# Patient Record
Sex: Female | Born: 1955 | Race: White | Hispanic: No | Smoking: Former smoker
Health system: Southern US, Community
[De-identification: ages and names within clinical notes are randomized; demographics above are authoritative.]

## PROBLEM LIST (undated history)

## (undated) DIAGNOSIS — M1990 Unspecified osteoarthritis, unspecified site: Secondary | ICD-10-CM

## (undated) DIAGNOSIS — E785 Hyperlipidemia, unspecified: Secondary | ICD-10-CM

## (undated) DIAGNOSIS — C801 Malignant (primary) neoplasm, unspecified: Secondary | ICD-10-CM

## (undated) DIAGNOSIS — K219 Gastro-esophageal reflux disease without esophagitis: Secondary | ICD-10-CM

## (undated) DIAGNOSIS — I10 Essential (primary) hypertension: Secondary | ICD-10-CM

## (undated) DIAGNOSIS — N390 Urinary tract infection, site not specified: Secondary | ICD-10-CM

## (undated) DIAGNOSIS — R7303 Prediabetes: Secondary | ICD-10-CM

## (undated) HISTORY — DX: Urinary tract infection, site not specified: N39.0

---

## 2004-12-14 ENCOUNTER — Ambulatory Visit: Payer: Self-pay | Admitting: Unknown Physician Specialty

## 2006-02-21 ENCOUNTER — Ambulatory Visit: Payer: Self-pay | Admitting: Unknown Physician Specialty

## 2008-03-20 ENCOUNTER — Ambulatory Visit: Payer: Self-pay | Admitting: Unknown Physician Specialty

## 2008-03-26 ENCOUNTER — Ambulatory Visit: Payer: Self-pay | Admitting: Unknown Physician Specialty

## 2009-10-21 ENCOUNTER — Ambulatory Visit: Payer: Self-pay | Admitting: Family Medicine

## 2010-03-17 ENCOUNTER — Ambulatory Visit: Payer: Self-pay | Admitting: Internal Medicine

## 2010-03-17 ENCOUNTER — Emergency Department: Payer: Self-pay | Admitting: Unknown Physician Specialty

## 2010-04-20 ENCOUNTER — Ambulatory Visit: Payer: Self-pay | Admitting: Unknown Physician Specialty

## 2010-08-09 LAB — HM MAMMOGRAPHY

## 2010-11-27 ENCOUNTER — Emergency Department: Payer: Self-pay | Admitting: Emergency Medicine

## 2011-06-01 LAB — HM PAP SMEAR: HM Pap smear: NORMAL

## 2011-06-02 ENCOUNTER — Ambulatory Visit: Payer: Self-pay | Admitting: Internal Medicine

## 2011-06-13 ENCOUNTER — Ambulatory Visit: Payer: Self-pay | Admitting: Internal Medicine

## 2011-07-05 ENCOUNTER — Ambulatory Visit: Payer: Self-pay | Admitting: Surgery

## 2011-07-05 HISTORY — PX: BREAST BIOPSY: SHX20

## 2011-07-19 LAB — PATHOLOGY REPORT

## 2011-08-11 ENCOUNTER — Ambulatory Visit: Payer: Self-pay | Admitting: Surgery

## 2011-08-11 DIAGNOSIS — I10 Essential (primary) hypertension: Secondary | ICD-10-CM

## 2011-08-11 LAB — CBC WITH DIFFERENTIAL/PLATELET
Basophil %: 0.5 %
Eosinophil #: 0.1 10*3/uL (ref 0.0–0.7)
Eosinophil %: 1.6 %
HCT: 40.1 % (ref 35.0–47.0)
Lymphocyte #: 1.7 10*3/uL (ref 1.0–3.6)
Lymphocyte %: 26.5 %
MCH: 30.4 pg (ref 26.0–34.0)
MCHC: 33.9 g/dL (ref 32.0–36.0)
MCV: 90 fL (ref 80–100)
Monocyte #: 0.5 10*3/uL (ref 0.0–0.7)
Neutrophil #: 4.2 10*3/uL (ref 1.4–6.5)
Neutrophil %: 64.4 %
RBC: 4.48 10*6/uL (ref 3.80–5.20)
WBC: 6.5 10*3/uL (ref 3.6–11.0)

## 2011-08-11 LAB — BASIC METABOLIC PANEL
Anion Gap: 12 (ref 7–16)
BUN: 14 mg/dL (ref 7–18)
Chloride: 106 mmol/L (ref 98–107)
Co2: 25 mmol/L (ref 21–32)
Creatinine: 0.95 mg/dL (ref 0.60–1.30)
EGFR (African American): >60
Potassium: 3.8 mmol/L (ref 3.5–5.1)
Sodium: 143 mmol/L (ref 136–145)

## 2011-08-22 ENCOUNTER — Ambulatory Visit: Payer: Self-pay | Admitting: Surgery

## 2011-08-22 HISTORY — PX: BREAST BIOPSY: SHX20

## 2012-09-19 HISTORY — PX: COLONOSCOPY: SHX174

## 2012-10-03 LAB — HM COLONOSCOPY

## 2013-07-17 LAB — LIPID PANEL
Cholesterol: 215 mg/dL — AB (ref 0–200)
HDL: 75 mg/dL — AB (ref 35–70)
LDL Cholesterol: 117 mg/dL
TRIGLYCERIDES: 117 mg/dL (ref 40–160)

## 2013-07-17 LAB — TSH: TSH: 1.1 u[IU]/mL (ref <=5.90)

## 2014-11-17 LAB — BASIC METABOLIC PANEL
BUN: 13 mg/dL (ref 4–21)
CREATININE: 0.7 mg/dL (ref <=1.1)

## 2014-11-17 LAB — CBC AND DIFFERENTIAL: HEMOGLOBIN: 13.7 g/dL (ref 12.0–16.0)

## 2014-11-21 ENCOUNTER — Ambulatory Visit
Admit: 2014-11-21 | Disposition: A | Discharge status: Home / Self Care | Payer: Self-pay | Attending: Internal Medicine | Admitting: Internal Medicine

## 2014-11-30 NOTE — H&P (Signed)
PATIENT NAME:  Lisa Haas, Lisa Haas MR#:  [id number] DATE OF BIRTH:  [birth year]  DATE OF ADMISSION:  08/22/2011  CHIEF COMPLAINT: Left mammographic abnormality.   HISTORY OF PRESENT ILLNESS: This is a patient with a history of stereotactic core biopsy which was initially read as atypical ductal hyperplasia but a second opinion from the pathologist at [facility] suggested that there could be suspicion for DCIS present as well. She is here for  elective excisional breast biopsy with needle localization.   PAST MEDICAL HISTORY:  1. Hyperlipidemia.  2. Hypertension.  3. Abnormal mammogram.   PAST SURGICAL HISTORY: None.   MEDICATIONS:  1. Amlodipine.  2. Lipitor.  3. Meclizine.   ALLERGIES: Penicillin and sulfa.   SOCIAL HISTORY: She is a former smoker. She drinks alcohol and is married.   FAMILY HISTORY:  Family history of breast cancer in her mother.  GYN HISTORY: G1, P1, Ab0.   REVIEW OF SYSTEMS: 10-system review has been performed and is documented in the office chart.   PHYSICAL EXAMINATION:  GENERAL: Healthy female patient.   NECK: No palpable neck nodes.   CHEST: Clear to auscultation.   CARDIAC: Regular rate and rhythm.   ABDOMEN: Soft, nontender.   EXTREMITIES: Without edema. Calves are nontender.   NEUROLOGIC: Grossly intact.   BREASTS: Examination shows no mass in either breast. There is a scar in the upper central portion of the left breast at a prior biopsy site.   ASSESSMENT AND PLAN: This is a patient with a core biopsy suggesting atypical ductal hyperplasia but with, possibly some atypia or ductal carcinoma in situ. She warrants excisional biopsy for this. The rationale for this has been discussed. The options of observation have been reviewed and the risks of bleeding, infection, recurrence, missed lesion, cosmetic deformity, hematoma, and seroma  have all been reviewed with her. She understood and agreed to proceed.    ____________________________ Jerrol Banana  Burt Knack, MD rec:bjt D: 08/21/2011 18:29:24 ET T: 08/22/2011 06:44:11 ET JOB#: [id number]  cc: Jerrol Banana. Burt Knack, MD, <Dictator> Florene Glen MD ELECTRONICALLY SIGNED 08/26/2011 21:44

## 2014-11-30 NOTE — Op Note (Signed)
PATIENT NAME:  Lisa Haas, Lisa Haas MR#:  [id number] DATE OF BIRTH:  1955-11-25  DATE OF PROCEDURE:  08/22/2011  PREOPERATIVE DIAGNOSIS: Left breast mammographic abnormality.   POSTOPERATIVE DIAGNOSIS: Left breast mammographic abnormality.  PROCEDURE PERFORMED:  Left needle localization breast biopsy.   SURGEON: Phoebe Perch, MD   ANESTHESIA: General with LMA.   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   Preoperatively, we discussed the rationale for surgery, the need for an excisional biopsy, and the rationale for offering excisional biopsy. We also discussed the risks of bleeding, infection, hematoma, seroma, and cosmetic deformity as well as missed lesion. This was all reviewed for her and her husband in the preop holding area. They understood and agreed to proceed.   FINDINGS: The previously-placed marker lie adjacent to the thick portion of the needle localization wire. The very tip of the wire was dislodged from the main body of the wire and remained in the patient. It was not able to be retrieved, or visible, or palpable. It was not seen on specimen mammogram. This was discussed with Dr. Burt Knack in Radiology, and I asked him to make note of this on the mammogram dictation so that future mammography would show this small portion of the tip of the wire. Again, the targeted area of the mammogram, meaning the mammographic abnormality as well as the metallic clip, was removed.   DESCRIPTION OF PROCEDURE: The patient was induced to general anesthesia, prepped and draped in sterile fashion. Marcaine was infiltrated in the skin and subcutaneous tissues around the previously placed needle localization wire and an incision was made. Dissection down along the wire was performed; and in elevating the specimen, it was noted that the tip of the wire was not in  the specimen suggesting that it had become dislodged within the tissue. The specimen was elevated and inspected and sectioned at the tip to assess for presence of the tip of the hook wire. Palpation and visual inspection of the cavity was performed. Additional tissue was removed and inspected and sent off for specimen mammogram.    Dr. Burt Knack called back stating that the specimen was targeted at the clip, and the marker was actually removed as expected; however, he did not see the tip of the wire. He suggested that he could do additional study and that was not visible.   Further palpation and visual inspection down to the chest wall was performed. It was decided that the very tip of the needle would cause no harm to the patient if being left in and that certainly additional excisional biopsy could result in additional harm or cosmetic deformity. Therefore, it was left in place knowing that the area of target was adequately excised.   Hemostasis was with electrocautery. Additional Marcaine was placed for a total of 30 mL. Another inspection was performed, and still the wire could not be palpated or visible. Therefore, closure was performed after assuring that hemostasis was adequate. This was done with deep sutures of 3-0 Vicryl followed by 4-0 subcuticular Monocryl. Steri-Strips, Mastisol, and sterile dressings were placed.   The patient tolerated the procedure well. There were no complications. She was taken to the recovery room in stable condition to be discharged in the care of her family with follow-up in 10 days.    ____________________________ Jerrol Banana Burt Knack, MD rec:cbb D: 08/22/2011 12:55:07  ET T: 08/22/2011 13:25:44 ET JOB#: [id number]  cc: Jerrol Banana. Burt Knack, MD, <Dictator> Florene Glen MD ELECTRONICALLY SIGNED 08/26/2011 21:44

## 2015-06-20 ENCOUNTER — Encounter: Payer: Self-pay | Admitting: Internal Medicine

## 2015-06-22 ENCOUNTER — Other Ambulatory Visit: Payer: Self-pay | Admitting: Internal Medicine

## 2015-06-22 DIAGNOSIS — I10 Essential (primary) hypertension: Secondary | ICD-10-CM | POA: Insufficient documentation

## 2015-06-22 DIAGNOSIS — N951 Menopausal and female climacteric states: Secondary | ICD-10-CM | POA: Insufficient documentation

## 2015-06-22 DIAGNOSIS — L918 Other hypertrophic disorders of the skin: Secondary | ICD-10-CM | POA: Insufficient documentation

## 2015-06-22 DIAGNOSIS — E785 Hyperlipidemia, unspecified: Secondary | ICD-10-CM | POA: Insufficient documentation

## 2015-06-22 DIAGNOSIS — N6019 Diffuse cystic mastopathy of unspecified breast: Secondary | ICD-10-CM | POA: Insufficient documentation

## 2015-06-22 DIAGNOSIS — R002 Palpitations: Secondary | ICD-10-CM | POA: Insufficient documentation

## 2015-06-22 DIAGNOSIS — Z86010 Personal history of colonic polyps: Secondary | ICD-10-CM | POA: Insufficient documentation

## 2015-10-05 ENCOUNTER — Other Ambulatory Visit: Payer: Self-pay | Admitting: Internal Medicine

## 2015-10-05 DIAGNOSIS — Z1231 Encounter for screening mammogram for malignant neoplasm of breast: Secondary | ICD-10-CM

## 2015-10-06 ENCOUNTER — Ambulatory Visit
Admission: RE | Admit: 2015-10-06 | Discharge: 2015-10-06 | Disposition: A | Discharge status: Home / Self Care | Payer: BC Managed Care – PPO | Source: Ambulatory Visit | Attending: Internal Medicine | Admitting: Internal Medicine

## 2015-10-06 DIAGNOSIS — Z1231 Encounter for screening mammogram for malignant neoplasm of breast: Secondary | ICD-10-CM | POA: Diagnosis present

## 2015-12-02 ENCOUNTER — Other Ambulatory Visit: Payer: Self-pay | Admitting: Internal Medicine

## 2016-03-07 ENCOUNTER — Other Ambulatory Visit: Payer: Self-pay | Admitting: Internal Medicine

## 2016-03-23 ENCOUNTER — Encounter: Payer: Self-pay | Admitting: Internal Medicine

## 2016-03-23 ENCOUNTER — Ambulatory Visit (INDEPENDENT_AMBULATORY_CARE_PROVIDER_SITE_OTHER): Payer: BC Managed Care – PPO | Admitting: Internal Medicine

## 2016-03-23 VITALS — BP 130/80 | HR 74 | Resp 16 | Ht 67.25 in | Wt 162.0 lb

## 2016-03-23 DIAGNOSIS — E785 Hyperlipidemia, unspecified: Secondary | ICD-10-CM

## 2016-03-23 DIAGNOSIS — Z124 Encounter for screening for malignant neoplasm of cervix: Secondary | ICD-10-CM | POA: Diagnosis not present

## 2016-03-23 DIAGNOSIS — Z0000 Encounter for general adult medical examination without abnormal findings: Secondary | ICD-10-CM

## 2016-03-23 DIAGNOSIS — I10 Essential (primary) hypertension: Secondary | ICD-10-CM

## 2016-03-23 DIAGNOSIS — F39 Unspecified mood [affective] disorder: Secondary | ICD-10-CM | POA: Diagnosis not present

## 2016-03-23 LAB — POCT URINALYSIS DIPSTICK
Bilirubin, UA: NEGATIVE
Blood, UA: NEGATIVE
GLUCOSE UA: NEGATIVE
Ketones, UA: NEGATIVE
LEUKOCYTES UA: NEGATIVE
Nitrite, UA: NEGATIVE
Protein, UA: NEGATIVE
SPEC GRAV UA: 1.025
pH, UA: 5

## 2016-03-23 MED ORDER — ATORVASTATIN CALCIUM 20 MG PO TABS: 20.0000 mg | ORAL_TABLET | Freq: Every day | ORAL | 3 refills | Status: DC

## 2016-03-23 MED ORDER — AMLODIPINE BESYLATE 10 MG PO TABS: 10.0000 mg | ORAL_TABLET | Freq: Every day | ORAL | 3 refills | Status: DC

## 2016-03-23 MED ORDER — ESCITALOPRAM OXALATE 10 MG PO TABS: 10.0000 mg | ORAL_TABLET | Freq: Every day | ORAL | 5 refills | Status: DC

## 2016-03-23 NOTE — Progress Notes (Signed)
Date:  03/23/2016   Name:  Lisa Haas   DOB:  27-Jul-1956   MRN:  [id number]   Chief Complaint: Annual Exam (PAP today) and Stress (JUST FYI not needing to treat stress just informing us: Husband dx stage 4 brain cancer) Lisa Haas is a 60 y.o. female who presents today for her Complete Annual Exam. She feels fairly well. She reports exercising some. She reports she is sleeping poorly because she gets up and down with her husband who has left sided hemiparesis.  She is working full time in caring for her husband nights and weekends. She has help from several family members during the day when she is working. She does find yourself to be very stressed at times some anxiety and panic symptoms as well as tearful and sad. She has lost about 15 pounds during the course of his illness since November. She feels like she is doing fairly well but might benefit from antidepressant and anti-anxiety medication. Hypertension  This is a chronic problem. The current episode started more than 1 year ago. The problem is unchanged. The problem is controlled. Pertinent negatives include no chest pain, headaches, palpitations or shortness of breath.  Hyperlipidemia  This is a chronic problem. The problem is controlled. Pertinent negatives include no chest pain or shortness of breath. Current antihyperlipidemic treatment includes statins. The current treatment provides significant improvement of lipids.    Review of Systems  Constitutional: Positive for unexpected weight change. Negative for chills, fatigue and fever.  HENT: Negative for congestion, hearing loss, tinnitus, trouble swallowing and voice change.   Eyes: Negative for visual disturbance.  Respiratory: Negative for cough, chest tightness, shortness of breath and wheezing.   Cardiovascular: Negative for chest pain, palpitations and leg swelling.  Gastrointestinal: Negative for abdominal pain, constipation, diarrhea and vomiting.  Endocrine:  Negative for polydipsia and polyuria.  Genitourinary: Negative for dysuria, frequency, genital sores, vaginal bleeding and vaginal discharge.  Musculoskeletal: Negative for arthralgias, gait problem and joint swelling.  Skin: Negative for color change and rash.  Neurological: Negative for dizziness, tremors, light-headedness and headaches.  Hematological: Negative for adenopathy. Does not bruise/bleed easily.  Psychiatric/Behavioral: Positive for dysphoric mood and sleep disturbance. Negative for agitation and confusion. The patient is nervous/anxious.     Patient Active Problem List   Diagnosis Date Noted  . 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 Dyslipidemia 06/22/2015  . Essential (primary) hypertension 06/22/2015  . H/O adenomatous polyp of colon 06/22/2015  . Hot flash, menopausal 06/22/2015  . Awareness of heartbeats 06/22/2015  . 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 Authorizing Provider  amLODipine (NORVASC) 10 MG tablet TAKE ONE TABLET BY MOUTH ONCE DAILY 03/07/16  Yes Glean Hess, MD  atorvastatin (LIPITOR) 20 MG tablet TAKE ONE TABLET BY MOUTH AT BEDTIME 03/07/16  Yes Glean Hess, MD    Allergies  Allergen Reactions  . Penicillins   . Sulfa Antibiotics     Other reaction(s): Headache    Past Surgical History:  Procedure Laterality Date  . BREAST BIOPSY Left 08/22/11   benign  . BREAST BIOPSY Left 07/05/11   lt stereo/clip-neg    Social History  Substance Use Topics  . Smoking status: Former Research scientist (life sciences)  . Smokeless tobacco: Never Used  . Alcohol use 1.2 oz/week    2 Standard drinks or equivalent per week     Medication list has been reviewed and updated.  Physical Exam  Constitutional: She is oriented to person, place, and time. She appears well-developed and well-nourished. No distress.  HENT:  Head: Normocephalic and atraumatic.  Right Ear: Tympanic membrane and ear canal normal.  Left  Ear: Tympanic membrane and ear canal normal.  Nose: Right sinus exhibits no maxillary sinus tenderness. Left sinus exhibits no maxillary sinus tenderness.  Mouth/Throat: Uvula is midline and oropharynx is clear and moist.  Eyes: Conjunctivae and EOM are normal. Right eye exhibits no discharge. Left eye exhibits no discharge. No scleral icterus.  Neck: Normal range of motion. Carotid bruit is not present. No erythema present. No thyromegaly present.  Cardiovascular: Normal rate, regular rhythm, normal heart sounds and normal pulses.   Pulmonary/Chest: Effort normal. No respiratory distress. She has no wheezes. Right breast exhibits no mass, no nipple discharge, no skin change and no tenderness. Left breast exhibits no mass, no nipple discharge, no skin change and no tenderness.  Abdominal: Soft. Bowel sounds are normal. There is no hepatosplenomegaly. There is no tenderness. There is no CVA tenderness.  Genitourinary: Rectum normal, vagina normal and uterus normal. There is no tenderness, lesion or injury on the right labia. There is no tenderness, lesion or injury on the left labia. Cervix exhibits no motion tenderness, no discharge and no friability. Right adnexum displays no mass, no tenderness and no fullness. Left adnexum displays no mass, no tenderness and no fullness.  Genitourinary Comments: Mild rectocele - asx  Musculoskeletal: Normal range of motion.  Lymphadenopathy:    She has no cervical adenopathy.    She has no axillary adenopathy.  Neurological: She is alert and oriented to person, place, and time. She has normal reflexes. No cranial nerve deficit or sensory deficit.  Skin: Skin is warm, dry and intact. No rash noted.  Psychiatric: Her speech is normal and behavior is normal. Thought content normal. She exhibits a depressed mood (and tearful but appropriate).  Nursing note and vitals reviewed.   BP 130/80 (BP Location: Right Arm, Patient Position: Sitting, Cuff Size: Normal)    Pulse 74   Resp 16   Ht 5' 7.25" (1.708 m)   Wt 162 lb (73.5 kg)   SpO2 100%   BMI 25.18 kg/m   Assessment and Plan: 1. Annual physical exam Pap obtained; mammogram up to date - POCT urinalysis dipstick  2. Essential (primary) hypertension controlled - CBC with Differential/Platelet - Comprehensive metabolic panel - TSH - amLODipine (NORVASC) 10 MG tablet; Take 1 tablet (10 mg total) by mouth daily.  Dispense: 90 tablet; Refill: 3  3. Dyslipidemia On statin therapy - Lipid panel - atorvastatin (LIPITOR) 20 MG tablet; Take 1 tablet (20 mg total) by mouth at bedtime.  Dispense: 90 tablet; Refill: 3  4. Encounter for screening for cervical cancer  - Pap IG and HPV (high risk) DNA detection  5. Mood disorder ([place]) Begin medication - follow up if needed - escitalopram (LEXAPRO) 10 MG tablet; Take 1 tablet (10 mg total) by mouth daily.  Dispense: 30 tablet; Refill: [place], MD [facility]  03/23/2016

## 2016-03-23 NOTE — Patient Instructions (Signed)
DASH Eating Plan  DASH stands for "Dietary Approaches to Stop Hypertension." The DASH eating plan is a healthy eating plan that has been shown to reduce high blood pressure (hypertension). Additional health benefits may include reducing the risk of type 2 diabetes mellitus, heart disease, and stroke. The DASH eating plan may also help with weight loss.  WHAT DO I NEED TO KNOW ABOUT THE DASH EATING PLAN?  For the DASH eating plan, you will follow these general guidelines:  · Choose foods with a percent daily value for sodium of less than 5% (as listed on the food label).  · Use salt-free seasonings or herbs instead of table salt or sea salt.  · Check with your health care provider or pharmacist before using salt substitutes.  · Eat lower-sodium products, often labeled as "lower sodium" or "no salt added."  · Eat fresh foods.  · Eat more vegetables, fruits, and low-fat dairy products.  · Choose whole grains. Look for the word "whole" as the first word in the ingredient list.  · Choose fish and skinless chicken or turkey more often than red meat. Limit fish, poultry, and meat to 6 oz (170 g) each day.  · Limit sweets, desserts, sugars, and sugary drinks.  · Choose heart-healthy fats.  · Limit cheese to 1 oz (28 g) per day.  · Eat more home-cooked food and less restaurant, buffet, and fast food.  · Limit fried foods.  · Cook foods using methods other than frying.  · Limit canned vegetables. If you do use them, rinse them well to decrease the sodium.  · When eating at a restaurant, ask that your food be prepared with less salt, or no salt if possible.  WHAT FOODS CAN I EAT?  Seek help from a dietitian for individual calorie needs.  Grains  Whole grain or whole wheat bread. Brown rice. Whole grain or whole wheat pasta. Quinoa, bulgur, and whole grain cereals. Low-sodium cereals. Corn or whole wheat flour tortillas. Whole grain cornbread. Whole grain crackers. Low-sodium crackers.  Vegetables  Fresh or frozen vegetables  (raw, steamed, roasted, or grilled). Low-sodium or reduced-sodium tomato and vegetable juices. Low-sodium or reduced-sodium tomato sauce and paste. Low-sodium or reduced-sodium canned vegetables.   Fruits  All fresh, canned (in natural juice), or frozen fruits.  Meat and Other Protein Products  Ground beef (85% or leaner), grass-fed beef, or beef trimmed of fat. Skinless chicken or turkey. Ground chicken or turkey. Pork trimmed of fat. All fish and seafood. Eggs. Dried beans, peas, or lentils. Unsalted nuts and seeds. Unsalted canned beans.  Dairy  Low-fat dairy products, such as skim or 1% milk, 2% or reduced-fat cheeses, low-fat ricotta or cottage cheese, or plain low-fat yogurt. Low-sodium or reduced-sodium cheeses.  Fats and Oils  Tub margarines without trans fats. Light or reduced-fat mayonnaise and salad dressings (reduced sodium). Avocado. Safflower, olive, or canola oils. Natural peanut or almond butter.  Other  Unsalted popcorn and pretzels.  The items listed above may not be a complete list of recommended foods or beverages. Contact your dietitian for more options.  WHAT FOODS ARE NOT RECOMMENDED?  Grains  White bread. White pasta. White rice. Refined cornbread. Bagels and croissants. Crackers that contain trans fat.  Vegetables  Creamed or fried vegetables. Vegetables in a cheese sauce. Regular canned vegetables. Regular canned tomato sauce and paste. Regular tomato and vegetable juices.  Fruits  Dried fruits. Canned fruit in light or heavy syrup. Fruit juice.  Meat and Other Protein   Products  Fatty cuts of meat. Ribs, chicken wings, bacon, sausage, bologna, salami, chitterlings, fatback, hot dogs, bratwurst, and packaged luncheon meats. Salted nuts and seeds. Canned beans with salt.  Dairy  Whole or 2% milk, cream, half-and-half, and cream cheese. Whole-fat or sweetened yogurt. Full-fat cheeses or blue cheese. Nondairy creamers and whipped toppings. Processed cheese, cheese spreads, or cheese  curds.  Condiments  Onion and garlic salt, seasoned salt, table salt, and sea salt. Canned and packaged gravies. Worcestershire sauce. Tartar sauce. Barbecue sauce. Teriyaki sauce. Soy sauce, including reduced sodium. Steak sauce. Fish sauce. Oyster sauce. Cocktail sauce. Horseradish. Ketchup and mustard. Meat flavorings and tenderizers. Bouillon cubes. Hot sauce. Tabasco sauce. Marinades. Taco seasonings. Relishes.  Fats and Oils  Butter, stick margarine, lard, shortening, ghee, and bacon fat. Coconut, palm kernel, or palm oils. Regular salad dressings.  Other  Pickles and olives. Salted popcorn and pretzels.  The items listed above may not be a complete list of foods and beverages to avoid. Contact your dietitian for more information.  WHERE CAN I FIND MORE INFORMATION?  National Heart, Lung, and Blood Institute: www.nhlbi.nih.gov/health/health-topics/topics/dash/     This information is not intended to replace advice given to you by your health care provider. Make sure you discuss any questions you have with your health care provider.     Document Released: 07/14/2011 Document Revised: 08/15/2014 Document Reviewed: 05/29/2013  Elsevier Interactive Patient Education ©2016 [employer] Inc[employer]

## 2016-03-24 LAB — CBC WITH DIFFERENTIAL/PLATELET
BASOS ABS: 0 10*3/uL (ref 0.0–0.2)
BASOS: 1 %
EOS (ABSOLUTE): 0.1 10*3/uL (ref 0.0–0.4)
Eos: 1 %
Hematocrit: 42.6 % (ref 34.0–46.6)
Hemoglobin: 14 g/dL (ref 11.1–15.9)
IMMATURE GRANS (ABS): 0 10*3/uL (ref 0.0–0.1)
IMMATURE GRANULOCYTES: 0 %
LYMPHS: 32 %
Lymphocytes Absolute: 2.3 10*3/uL (ref 0.7–3.1)
MCH: 30.1 pg (ref 26.6–33.0)
MCHC: 32.9 g/dL (ref 31.5–35.7)
MCV: 92 fL (ref 79–97)
Monocytes Absolute: 0.3 10*3/uL (ref 0.1–0.9)
Monocytes: 4 %
NEUTROS PCT: 62 %
Neutrophils Absolute: 4.4 10*3/uL (ref 1.4–7.0)
PLATELETS: 243 10*3/uL (ref 150–379)
RBC: 4.65 x10E6/uL (ref 3.77–5.28)
RDW: 12.8 % (ref 12.3–15.4)
WBC: 7.1 10*3/uL (ref 3.4–10.8)

## 2016-03-24 LAB — COMPREHENSIVE METABOLIC PANEL
ALT: 19 IU/L (ref 0–32)
AST: 23 IU/L (ref 0–40)
Albumin/Globulin Ratio: 2.2 (ref 1.2–2.2)
Albumin: 5 g/dL (ref 3.5–5.5)
Alkaline Phosphatase: 75 IU/L (ref 39–117)
BUN/Creatinine Ratio: 17 (ref 9–23)
BUN: 11 mg/dL (ref 6–24)
Bilirubin Total: 0.4 mg/dL (ref 0.0–1.2)
CO2: 21 mmol/L (ref 18–29)
Calcium: 9.7 mg/dL (ref 8.7–10.2)
Chloride: 102 mmol/L (ref 96–106)
Creatinine, Ser: 0.66 mg/dL (ref 0.57–1.00)
GFR, EST AFRICAN AMERICAN: 113 mL/min/{1.73_m2} (ref >59)
GFR, EST NON AFRICAN AMERICAN: 98 mL/min/{1.73_m2} (ref >59)
Globulin, Total: 2.3 g/dL (ref 1.5–4.5)
Glucose: 98 mg/dL (ref 65–99)
Potassium: 4 mmol/L (ref 3.5–5.2)
Sodium: 142 mmol/L (ref 134–144)
TOTAL PROTEIN: 7.3 g/dL (ref 6.0–8.5)

## 2016-03-24 LAB — LIPID PANEL
CHOL/HDL RATIO: 2.7 ratio (ref 0.0–4.4)
Cholesterol, Total: 214 mg/dL — ABNORMAL HIGH (ref 100–199)
HDL: 79 mg/dL (ref >39)
LDL Calculated: 113 mg/dL — ABNORMAL HIGH (ref 0–99)
TRIGLYCERIDES: 108 mg/dL (ref 0–149)
VLDL CHOLESTEROL CAL: 22 mg/dL (ref 5–40)

## 2016-03-24 LAB — TSH: TSH: 1.68 u[IU]/mL (ref 0.450–4.500)

## 2016-03-26 LAB — PAP IG AND HPV HIGH-RISK
HPV, HIGH-RISK: NEGATIVE
PAP Smear Comment: 0

## 2016-06-01 ENCOUNTER — Ambulatory Visit (INDEPENDENT_AMBULATORY_CARE_PROVIDER_SITE_OTHER): Payer: BC Managed Care – PPO | Admitting: Internal Medicine

## 2016-06-01 ENCOUNTER — Encounter: Payer: Self-pay | Admitting: Internal Medicine

## 2016-06-01 VITALS — BP 122/78 | HR 84 | Temp 98.7°F | Resp 16 | Ht 67.25 in | Wt 161.0 lb

## 2016-06-01 DIAGNOSIS — H1033 Unspecified acute conjunctivitis, bilateral: Secondary | ICD-10-CM | POA: Diagnosis not present

## 2016-06-01 MED ORDER — NEOMYCIN-POLYMYXIN-DEXAMETH 3.5-10000-0.1 OP SUSP: 2.0000 [drp] | Freq: Four times a day (QID) | OPHTHALMIC | 1 refills | Status: DC

## 2016-06-01 NOTE — Patient Instructions (Signed)

## 2016-06-01 NOTE — Progress Notes (Signed)
Date:  06/01/2016   Name:  Lisa Haas   DOB:  1956/05/24   MRN:  [id number]   Chief Complaint: Conjunctivitis (1 day - was matted this morning ); Sore Throat (this morning but not now. ); and Headache (Last night and today ) Conjunctivitis   The current episode started yesterday. The onset was sudden. The problem occurs continuously. The problem has been gradually worsening. The problem is moderate. Nothing relieves the symptoms. Associated symptoms include eye discharge and eye redness. Pertinent negatives include no fever. The eye pain is mild. The left eye is affected.      Review of Systems  Constitutional: Negative for chills, fatigue and fever.  HENT: Negative for facial swelling and sinus pressure.   Eyes: Positive for discharge, redness and visual disturbance.  Respiratory: Negative for chest tightness and shortness of breath.   Cardiovascular: Negative for chest pain.    Patient Active Problem List   Diagnosis Date Noted  . 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 Dyslipidemia 06/22/2015  . Essential (primary) hypertension 06/22/2015  . H/O adenomatous polyp of colon 06/22/2015  . Hot flash, menopausal 06/22/2015  . Awareness of heartbeats 06/22/2015  . 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 Authorizing Provider  amLODipine (NORVASC) 10 MG tablet Take 1 tablet (10 mg total) by mouth daily. 03/23/16  Yes Glean Hess, MD  atorvastatin (LIPITOR) 20 MG tablet Take 1 tablet (20 mg total) by mouth at bedtime. 03/23/16  Yes Glean Hess, MD  escitalopram (LEXAPRO) 10 MG tablet Take 1 tablet (10 mg total) by mouth daily. 03/23/16  Yes Glean Hess, MD    Allergies  Allergen Reactions  . Penicillins   . Sulfa Antibiotics     Other reaction(s): Headache    Past Surgical History:  Procedure Laterality Date  . BREAST BIOPSY Left 08/22/11   benign  . BREAST BIOPSY Left 07/05/11   lt stereo/clip-neg      Social History  Substance Use Topics  . Smoking status: Former Research scientist (life sciences)  . Smokeless tobacco: Never Used  . Alcohol use 1.2 oz/week    2 Standard drinks or equivalent per week     Medication list has been reviewed and updated.   Physical Exam  Constitutional: She is oriented to person, place, and time. She appears well-developed. No distress.  HENT:  Head: Normocephalic and atraumatic.  Right Ear: Tympanic membrane and ear canal normal.  Left Ear: Tympanic membrane and ear canal normal.  Nose: Right sinus exhibits no maxillary sinus tenderness and no frontal sinus tenderness. Left sinus exhibits no maxillary sinus tenderness and no frontal sinus tenderness.  Mouth/Throat: Oropharynx is clear and moist.  Eyes: Right eye exhibits no chemosis and no discharge. Left eye exhibits chemosis and discharge. Right conjunctiva is not injected. Left conjunctiva is injected.  Cardiovascular: Normal rate and regular rhythm.   Pulmonary/Chest: Effort normal and breath sounds normal. No respiratory distress.  Musculoskeletal: Normal range of motion.  Neurological: She is alert and oriented to person, place, and time.  Skin: Skin is warm and dry. No rash noted.  Psychiatric: She has a normal mood and affect. Her behavior is normal. Thought content normal.  Nursing note and vitals reviewed.   BP 122/78   Pulse 84   Temp 98.7 F (37.1 C) (Oral)   Resp 16   Ht 5' 7.25" (1.708 m)   Wt 161 lb (73 kg)   SpO2  98%   BMI 25.03 kg/m   Assessment and Plan: 1. Acute conjunctivitis of both eyes, unspecified acute conjunctivitis type - neomycin-polymyxin b-dexamethasone (MAXITROL) 3.5-10000-0.1 SUSP; Place 2 drops into both eyes every 6 (six) hours.  Dispense: 5 mL; Refill: [place], MD [facility]  06/01/2016

## 2016-11-22 ENCOUNTER — Other Ambulatory Visit: Payer: Self-pay | Admitting: Internal Medicine

## 2016-11-22 DIAGNOSIS — Z1239 Encounter for other screening for malignant neoplasm of breast: Secondary | ICD-10-CM

## 2016-11-28 ENCOUNTER — Ambulatory Visit: Payer: BC Managed Care – PPO

## 2016-11-30 ENCOUNTER — Ambulatory Visit
Admission: RE | Admit: 2016-11-30 | Discharge: 2016-11-30 | Disposition: A | Discharge status: Home / Self Care | Payer: BC Managed Care – PPO | Source: Ambulatory Visit | Attending: Internal Medicine | Admitting: Internal Medicine

## 2016-11-30 DIAGNOSIS — Z1231 Encounter for screening mammogram for malignant neoplasm of breast: Secondary | ICD-10-CM | POA: Diagnosis not present

## 2016-11-30 DIAGNOSIS — Z1239 Encounter for other screening for malignant neoplasm of breast: Secondary | ICD-10-CM

## 2017-03-27 ENCOUNTER — Encounter: Payer: Self-pay | Admitting: Internal Medicine

## 2017-03-27 ENCOUNTER — Ambulatory Visit (INDEPENDENT_AMBULATORY_CARE_PROVIDER_SITE_OTHER): Payer: BC Managed Care – PPO | Admitting: Internal Medicine

## 2017-03-27 VITALS — BP 128/80 | HR 99 | Ht 67.25 in | Wt 166.0 lb

## 2017-03-27 DIAGNOSIS — E785 Hyperlipidemia, unspecified: Secondary | ICD-10-CM | POA: Diagnosis not present

## 2017-03-27 DIAGNOSIS — Z0000 Encounter for general adult medical examination without abnormal findings: Secondary | ICD-10-CM | POA: Diagnosis not present

## 2017-03-27 DIAGNOSIS — Z1231 Encounter for screening mammogram for malignant neoplasm of breast: Secondary | ICD-10-CM

## 2017-03-27 DIAGNOSIS — I10 Essential (primary) hypertension: Secondary | ICD-10-CM | POA: Diagnosis not present

## 2017-03-27 DIAGNOSIS — R002 Palpitations: Secondary | ICD-10-CM

## 2017-03-27 LAB — POCT URINALYSIS DIPSTICK
Bilirubin, UA: NEGATIVE
Glucose, UA: NEGATIVE
Ketones, UA: NEGATIVE
LEUKOCYTES UA: NEGATIVE
NITRITE UA: NEGATIVE
PH UA: 6 (ref 5.0–8.0)
RBC UA: NEGATIVE
Spec Grav, UA: 1.025 (ref 1.010–1.025)
UROBILINOGEN UA: 0.2 U/dL

## 2017-03-27 MED ORDER — AMLODIPINE BESYLATE 10 MG PO TABS: 10.0000 mg | ORAL_TABLET | Freq: Every day | ORAL | 3 refills | Status: DC

## 2017-03-27 MED ORDER — ATORVASTATIN CALCIUM 20 MG PO TABS: 20.0000 mg | ORAL_TABLET | Freq: Every day | ORAL | 3 refills | Status: DC

## 2017-03-27 NOTE — Patient Instructions (Signed)
DASH Eating Plan DASH stands for "Dietary Approaches to Stop Hypertension." The DASH eating plan is a healthy eating plan that has been shown to reduce high blood pressure (hypertension). It may also reduce your risk for type 2 diabetes, heart disease, and stroke. The DASH eating plan may also help with weight loss. What are tips for following this plan? General guidelines  Avoid eating more than 2,300 mg (milligrams) of salt (sodium) a day. If you have hypertension, you may need to reduce your sodium intake to 1,500 mg a day.  Limit alcohol intake to no more than 1 drink a day for nonpregnant women and 2 drinks a day for men. One drink equals 12 oz of beer, 5 oz of wine, or 1 oz of hard liquor.  Work with your health care provider to maintain a healthy body weight or to lose weight. Ask what an ideal weight is for you.  Get at least 30 minutes of exercise that causes your heart to beat faster (aerobic exercise) most days of the week. Activities may include walking, swimming, or biking.  Work with your health care provider or diet and nutrition specialist (dietitian) to adjust your eating plan to your individual calorie needs. Reading food labels  Check food labels for the amount of sodium per serving. Choose foods with less than 5 percent of the Daily Value of sodium. Generally, foods with less than 300 mg of sodium per serving fit into this eating plan.  To find whole grains, look for the word "whole" as the first word in the ingredient list. Shopping  Buy products labeled as "low-sodium" or "no salt added."  Buy fresh foods. Avoid canned foods and premade or frozen meals. Cooking  Avoid adding salt when cooking. Use salt-free seasonings or herbs instead of table salt or sea salt. Check with your health care provider or pharmacist before using salt substitutes.  Do not fry foods. Cook foods using healthy methods such as baking, boiling, grilling, and broiling instead.  Cook with  heart-healthy oils, such as olive, canola, soybean, or sunflower oil. Meal planning   Eat a balanced diet that includes: ? 5 or more servings of fruits and vegetables each day. At each meal, try to fill half of your plate with fruits and vegetables. ? Up to 6-8 servings of whole grains each day. ? Less than 6 oz of lean meat, poultry, or fish each day. A 3-oz serving of meat is about the same size as a deck of cards. One egg equals 1 oz. ? 2 servings of low-fat dairy each day. ? A serving of nuts, seeds, or beans 5 times each week. ? Heart-healthy fats. Healthy fats called Omega-3 fatty acids are found in foods such as flaxseeds and coldwater fish, like sardines, salmon, and mackerel.  Limit how much you eat of the following: ? Canned or prepackaged foods. ? Food that is high in trans fat, such as fried foods. ? Food that is high in saturated fat, such as fatty meat. ? Sweets, desserts, sugary drinks, and other foods with added sugar. ? Full-fat dairy products.  Do not salt foods before eating.  Try to eat at least 2 vegetarian meals each week.  Eat more home-cooked food and less restaurant, buffet, and fast food.  When eating at a restaurant, ask that your food be prepared with less salt or no salt, if possible. What foods are recommended? The items listed may not be a complete list. Talk with your dietitian about what   dietary choices are best for you. Grains Whole-grain or whole-wheat bread. Whole-grain or whole-wheat pasta. Brown rice. Oatmeal. Quinoa. Bulgur. Whole-grain and low-sodium cereals. Pita bread. Low-fat, low-sodium crackers. Whole-wheat flour tortillas. Vegetables Fresh or frozen vegetables (raw, steamed, roasted, or grilled). Low-sodium or reduced-sodium tomato and vegetable juice. Low-sodium or reduced-sodium tomato sauce and tomato paste. Low-sodium or reduced-sodium canned vegetables. Fruits All fresh, dried, or frozen fruit. Canned fruit in natural juice (without  added sugar). Meat and other protein foods Skinless chicken or turkey. Ground chicken or turkey. Pork with fat trimmed off. Fish and seafood. Egg whites. Dried beans, peas, or lentils. Unsalted nuts, nut butters, and seeds. Unsalted canned beans. Lean cuts of beef with fat trimmed off. Low-sodium, lean deli meat. Dairy Low-fat (1%) or fat-free (skim) milk. Fat-free, low-fat, or reduced-fat cheeses. Nonfat, low-sodium ricotta or cottage cheese. Low-fat or nonfat yogurt. Low-fat, low-sodium cheese. Fats and oils Soft margarine without trans fats. Vegetable oil. Low-fat, reduced-fat, or light mayonnaise and salad dressings (reduced-sodium). Canola, safflower, olive, soybean, and sunflower oils. Avocado. Seasoning and other foods Herbs. Spices. Seasoning mixes without salt. Unsalted popcorn and pretzels. Fat-free sweets. What foods are not recommended? The items listed may not be a complete list. Talk with your dietitian about what dietary choices are best for you. Grains Baked goods made with fat, such as croissants, muffins, or some breads. Dry pasta or rice meal packs. Vegetables Creamed or fried vegetables. Vegetables in a cheese sauce. Regular canned vegetables (not low-sodium or reduced-sodium). Regular canned tomato sauce and paste (not low-sodium or reduced-sodium). Regular tomato and vegetable juice (not low-sodium or reduced-sodium). Pickles. Olives. Fruits Canned fruit in a light or heavy syrup. Fried fruit. Fruit in cream or butter sauce. Meat and other protein foods Fatty cuts of meat. Ribs. Fried meat. Bacon. Sausage. Bologna and other processed lunch meats. Salami. Fatback. Hotdogs. Bratwurst. Salted nuts and seeds. Canned beans with added salt. Canned or smoked fish. Whole eggs or egg yolks. Chicken or turkey with skin. Dairy Whole or 2% milk, cream, and half-and-half. Whole or full-fat cream cheese. Whole-fat or sweetened yogurt. Full-fat cheese. Nondairy creamers. Whipped toppings.  Processed cheese and cheese spreads. Fats and oils Butter. Stick margarine. Lard. Shortening. Ghee. Bacon fat. Tropical oils, such as coconut, palm kernel, or palm oil. Seasoning and other foods Salted popcorn and pretzels. Onion salt, garlic salt, seasoned salt, table salt, and sea salt. Worcestershire sauce. Tartar sauce. Barbecue sauce. Teriyaki sauce. Soy sauce, including reduced-sodium. Steak sauce. Canned and packaged gravies. Fish sauce. Oyster sauce. Cocktail sauce. Horseradish that you find on the shelf. Ketchup. Mustard. Meat flavorings and tenderizers. Bouillon cubes. Hot sauce and Tabasco sauce. Premade or packaged marinades. Premade or packaged taco seasonings. Relishes. Regular salad dressings. Where to find more information:  National Heart, Lung, and Blood Institute: www.nhlbi.nih.gov  American Heart Association: www.heart.org Summary  The DASH eating plan is a healthy eating plan that has been shown to reduce high blood pressure (hypertension). It may also reduce your risk for type 2 diabetes, heart disease, and stroke.  With the DASH eating plan, you should limit salt (sodium) intake to 2,300 mg a day. If you have hypertension, you may need to reduce your sodium intake to 1,500 mg a day.  When on the DASH eating plan, aim to eat more fresh fruits and vegetables, whole grains, lean proteins, low-fat dairy, and heart-healthy fats.  Work with your health care provider or diet and nutrition specialist (dietitian) to adjust your eating plan to your individual   calorie needs. This information is not intended to replace advice given to you by your health care provider. Make sure you discuss any questions you have with your health care provider. Document Released: 07/14/2011 Document Revised: 07/18/2016 Document Reviewed: 07/18/2016 Elsevier Interactive Patient Education  2017 Elsevier Inc.  

## 2017-03-27 NOTE — Progress Notes (Signed)
Date:  03/27/2017   Name:  Lisa Haas   DOB:  02/12/1956   MRN:  [id number]   Chief Complaint: Annual Exam (Breast Exam. ) Lisa Haas is a 61 y.o. female who presents today for her Complete Annual Exam. She feels fairly well. She reports exercising regularly. She reports she is sleeping fairly well. Recent mammogram was normal.  Pap was done last year.  Hypertension  This is a chronic problem. The problem is controlled. Associated symptoms include palpitations (without pain, SOB, dizziness). Pertinent negatives include no chest pain, headaches or shortness of breath. Past treatments include calcium channel blockers. The current treatment provides significant improvement.  Hyperlipidemia  This is a chronic problem. The problem is controlled. Pertinent negatives include no chest pain or shortness of breath. Current antihyperlipidemic treatment includes statins.   Lab Results  Component Value Date   CHOL 214 (H) 03/23/2016   HDL 79 03/23/2016   LDLCALC 113 (H) 03/23/2016   TRIG 108 03/23/2016   CHOLHDL 2.7 03/23/2016   Lab Results  Component Value Date   CREATININE 0.66 03/23/2016      Review of Systems  Constitutional: Negative for chills, fatigue and fever.  HENT: Negative for congestion, hearing loss, tinnitus, trouble swallowing and voice change.   Eyes: Negative for visual disturbance.  Respiratory: Negative for cough, chest tightness, shortness of breath and wheezing.   Cardiovascular: Positive for palpitations (without pain, SOB, dizziness). Negative for chest pain and leg swelling.  Gastrointestinal: Negative for abdominal pain, constipation, diarrhea and vomiting.       Gerd occasionally and mild dysphagia  Endocrine: Negative for polydipsia and polyuria.  Genitourinary: Negative for dysuria, frequency, genital sores, vaginal bleeding and vaginal discharge.  Musculoskeletal: Negative for arthralgias, gait problem and joint swelling.  Skin: Negative for color  change and rash.  Neurological: Negative for dizziness, tremors, light-headedness and headaches.  Hematological: Negative for adenopathy. Does not bruise/bleed easily.  Psychiatric/Behavioral: Negative for dysphoric mood and sleep disturbance. The patient is not nervous/anxious.     Patient Active Problem List   Diagnosis Date Noted  . 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 Dyslipidemia 06/22/2015  . Essential (primary) hypertension 06/22/2015  . H/O adenomatous polyp of colon 06/22/2015  . Hot flash, menopausal 06/22/2015  . Awareness of heartbeats 06/22/2015  . 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 Authorizing Provider  amLODipine (NORVASC) 10 MG tablet Take 1 tablet (10 mg total) by mouth daily. 03/23/16  Yes Glean Hess, MD  atorvastatin (LIPITOR) 20 MG tablet Take 1 tablet (20 mg total) by mouth at bedtime. 03/23/16  Yes Glean Hess, MD    Allergies  Allergen Reactions  . Penicillins   . Sulfa Antibiotics     Other reaction(s): Headache    Past Surgical History:  Procedure Laterality Date  . BREAST BIOPSY Left 08/22/11   benign  . BREAST BIOPSY Left 07/05/11   lt stereo/clip-neg  . COLONOSCOPY  09/19/2012   at [place] History  Substance Use Topics  . Smoking status: Former Research scientist (life sciences)  . Smokeless tobacco: Never Used  . Alcohol use 1.2 oz/week    2 Standard drinks or equivalent per week   Depression screen [facility] 2/9 03/27/2017 03/23/2016  Decreased Interest 0 0  Down, Depressed, Hopeless 0 0  PHQ - 2 Score 0 0  Altered sleeping - 3  PHQ-9 Score - 3     Medication  list has been reviewed and updated.   Physical Exam  Constitutional: She is oriented to person, place, and time. She appears well-developed and well-nourished. No distress.  HENT:  Head: Normocephalic and atraumatic.  Right Ear: Tympanic membrane and ear canal normal.  Left Ear: Tympanic membrane and ear canal normal.  Nose:  Right sinus exhibits no maxillary sinus tenderness. Left sinus exhibits no maxillary sinus tenderness.  Mouth/Throat: Uvula is midline and oropharynx is clear and moist.  Eyes: Conjunctivae and EOM are normal. Right eye exhibits no discharge. Left eye exhibits no discharge. No scleral icterus.  Neck: Normal range of motion. Carotid bruit is not present. No erythema present. No thyromegaly present.  Cardiovascular: Normal rate, regular rhythm, S1 normal, normal heart sounds and normal pulses.  Frequent extrasystoles are present. Exam reveals no gallop.   No murmur heard. Pulmonary/Chest: Effort normal. No respiratory distress. She has no wheezes. Right breast exhibits no mass, no nipple discharge, no skin change and no tenderness. Left breast exhibits no mass, no nipple discharge, no skin change and no tenderness.  Abdominal: Soft. Bowel sounds are normal. There is no hepatosplenomegaly. There is no tenderness. There is no CVA tenderness.  Musculoskeletal: Normal range of motion.  Lymphadenopathy:    She has no cervical adenopathy.    She has no axillary adenopathy.  Neurological: She is alert and oriented to person, place, and time. She has normal reflexes. No cranial nerve deficit or sensory deficit.  Skin: Skin is warm, dry and intact. No rash noted.  Psychiatric: She has a normal mood and affect. Her speech is normal and behavior is normal. Thought content normal.  Nursing note and vitals reviewed.   BP 128/80   Pulse 99   Ht 5' 7.25" (1.708 m)   Wt 166 lb (75.3 kg)   SpO2 98%   BMI 25.81 kg/m   Assessment and Plan: 1. Annual physical exam Continue exercise and healthy diet - POCT urinalysis dipstick  2. Essential (primary) hypertension controlled - CBC with Differential/Platelet - Comprehensive metabolic panel - TSH - EKG 12-Lead - amLODipine (NORVASC) 10 MG tablet; Take 1 tablet (10 mg total) by mouth daily.  Dispense: 90 tablet; Refill: 3  3. Dyslipidemia - Lipid  panel - atorvastatin (LIPITOR) 20 MG tablet; Take 1 tablet (20 mg total) by mouth at bedtime.  Dispense: 90 tablet; Refill: 3  4. Encounter for screening mammogram for breast cancer - MM DIGITAL SCREENING BILATERAL; Future  5. Heart palpitations Asymptomatic with normal EKG showing only short PRi = 96 msec (unchanged from 2011) No treatment is indicated at this time   Meds ordered this encounter  Medications  . atorvastatin (LIPITOR) 20 MG tablet    Sig: Take 1 tablet (20 mg total) by mouth at bedtime.    Dispense:  90 tablet    Refill:  3  . amLODipine (NORVASC) 10 MG tablet    Sig: Take 1 tablet (10 mg total) by mouth daily.    Dispense:  90 tablet    Refill:  [place], MD [facility]  03/27/2017

## 2017-03-28 LAB — TSH: TSH: 1.37 u[IU]/mL (ref 0.450–4.500)

## 2017-03-28 LAB — COMPREHENSIVE METABOLIC PANEL
ALK PHOS: 76 IU/L (ref 39–117)
ALT: 17 IU/L (ref 0–32)
AST: 21 IU/L (ref 0–40)
Albumin/Globulin Ratio: 2.4 — ABNORMAL HIGH (ref 1.2–2.2)
Albumin: 4.7 g/dL (ref 3.5–5.5)
BILIRUBIN TOTAL: 0.7 mg/dL (ref 0.0–1.2)
BUN/Creatinine Ratio: 17 (ref 9–23)
BUN: 15 mg/dL (ref 6–24)
CHLORIDE: 104 mmol/L (ref 96–106)
CO2: 22 mmol/L (ref 20–29)
CREATININE: 0.86 mg/dL (ref 0.57–1.00)
Calcium: 9.4 mg/dL (ref 8.7–10.2)
GFR calc Af Amer: 86 mL/min/{1.73_m2} (ref >59)
GFR calc non Af Amer: 74 mL/min/{1.73_m2} (ref >59)
Globulin, Total: 2 g/dL (ref 1.5–4.5)
Glucose: 102 mg/dL — ABNORMAL HIGH (ref 65–99)
Potassium: 4.1 mmol/L (ref 3.5–5.2)
Sodium: 143 mmol/L (ref 134–144)
Total Protein: 6.7 g/dL (ref 6.0–8.5)

## 2017-03-28 LAB — CBC WITH DIFFERENTIAL/PLATELET
BASOS ABS: 0 10*3/uL (ref 0.0–0.2)
Basos: 0 %
EOS (ABSOLUTE): 0.1 10*3/uL (ref 0.0–0.4)
Eos: 2 %
Hematocrit: 41.3 % (ref 34.0–46.6)
Hemoglobin: 13.9 g/dL (ref 11.1–15.9)
Immature Grans (Abs): 0 10*3/uL (ref 0.0–0.1)
Immature Granulocytes: 0 %
LYMPHS ABS: 1.5 10*3/uL (ref 0.7–3.1)
Lymphs: 26 %
MCH: 30.5 pg (ref 26.6–33.0)
MCHC: 33.7 g/dL (ref 31.5–35.7)
MCV: 91 fL (ref 79–97)
MONOCYTES: 9 %
Monocytes Absolute: 0.5 10*3/uL (ref 0.1–0.9)
NEUTROS PCT: 63 %
Neutrophils Absolute: 3.7 10*3/uL (ref 1.4–7.0)
PLATELETS: 235 10*3/uL (ref 150–379)
RBC: 4.55 x10E6/uL (ref 3.77–5.28)
RDW: 13.1 % (ref 12.3–15.4)
WBC: 5.9 10*3/uL (ref 3.4–10.8)

## 2017-03-28 LAB — LIPID PANEL
CHOLESTEROL TOTAL: 196 mg/dL (ref 100–199)
Chol/HDL Ratio: 2.7 ratio (ref 0.0–4.4)
HDL: 73 mg/dL (ref >39)
LDL CALC: 93 mg/dL (ref 0–99)
TRIGLYCERIDES: 152 mg/dL — AB (ref 0–149)
VLDL CHOLESTEROL CAL: 30 mg/dL (ref 5–40)

## 2017-09-28 ENCOUNTER — Encounter: Payer: Self-pay | Admitting: Internal Medicine

## 2017-09-28 ENCOUNTER — Ambulatory Visit: Payer: BC Managed Care – PPO | Admitting: Internal Medicine

## 2017-09-28 VITALS — BP 132/78 | HR 86 | Ht 67.5 in | Wt 170.0 lb

## 2017-09-28 DIAGNOSIS — K219 Gastro-esophageal reflux disease without esophagitis: Secondary | ICD-10-CM | POA: Diagnosis not present

## 2017-09-28 DIAGNOSIS — I10 Essential (primary) hypertension: Secondary | ICD-10-CM

## 2017-09-28 DIAGNOSIS — Z86010 Personal history of colonic polyps: Secondary | ICD-10-CM | POA: Diagnosis not present

## 2017-09-28 NOTE — Progress Notes (Signed)
Date:  09/28/2017   Name:  Lisa Haas   DOB:  [birth year]   MRN:  [id number]   Chief Complaint: Hypertension Hypertension  This is a chronic problem. The problem is controlled. Pertinent negatives include no chest pain, headaches, palpitations or shortness of breath. Past treatments include calcium channel blockers. The current treatment provides significant improvement.  Gastroesophageal Reflux  She complains of heartburn. She reports no abdominal pain, no chest pain or no coughing. This is a recurrent problem. The problem occurs rarely. Pertinent negatives include no fatigue. She has tried a PPI for the symptoms.  She reports 2 episodes of food sticking in her esophagus - lasted only a few minutes then was able to swallow.  She was eating too fast both times.    Review of Systems  Constitutional: Negative for appetite change, fatigue, fever and unexpected weight change.  HENT: Positive for trouble swallowing (has had several episodes of food sticking in her throat). Negative for tinnitus.   Eyes: Negative for visual disturbance.  Respiratory: Negative for cough, chest tightness and shortness of breath.   Cardiovascular: Negative for chest pain, palpitations and leg swelling.  Gastrointestinal: Positive for heartburn. Negative for abdominal pain.  Genitourinary: Negative for dysuria and hematuria.  Musculoskeletal: Negative for arthralgias.  Neurological: Negative for tremors, numbness and headaches.  Psychiatric/Behavioral: Negative for dysphoric mood.    Patient Active Problem List   Diagnosis Date Noted  . 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 Dyslipidemia 06/22/2015  . Essential (primary) hypertension 06/22/2015  . H/O adenomatous polyp of colon 06/22/2015  . Hot flash, menopausal 06/22/2015  . Heart palpitations 06/22/2015  . 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 Authorizing Provider  amLODipine (NORVASC)  10 MG tablet Take 1 tablet (10 mg total) by mouth daily. 03/27/17   Glean Hess, MD  atorvastatin (LIPITOR) 20 MG tablet Take 1 tablet (20 mg total) by mouth at bedtime. 03/27/17   Glean Hess, MD    Allergies  Allergen Reactions  . Penicillins   . Sulfa Antibiotics     Other reaction(s): Headache    Past Surgical History:  Procedure Laterality Date  . BREAST BIOPSY Left 08/22/11   benign  . BREAST BIOPSY Left 07/05/11   lt stereo/clip-neg  . COLONOSCOPY  09/19/2012   at [place] History   Tobacco Use  . Smoking status: Former Research scientist (life sciences)  . Smokeless tobacco: Never Used  Substance Use Topics  . Alcohol use: Yes    Alcohol/week: 1.2 oz    Types: 2 Standard drinks or equivalent per week  . Drug use: Yes    Types: Marijuana     Medication list has been reviewed and updated.  PHQ 2/9 Scores 03/27/2017 03/23/2016  PHQ - 2 Score 0 0  PHQ- 9 Score - 3    Physical Exam  Constitutional: She is oriented to person, place, and time. She appears well-developed. No distress.  HENT:  Head: Normocephalic and atraumatic.  Neck: Normal range of motion. Neck supple. Carotid bruit is not present.  Cardiovascular: Normal rate, regular rhythm and normal heart sounds.  Pulmonary/Chest: Effort normal and breath sounds normal. No respiratory distress. She has no wheezes.  Musculoskeletal: Normal range of motion. She exhibits no edema or tenderness.  Neurological: She is alert and oriented to person, place, and time.  Skin: Skin is warm and dry. No rash noted.  Psychiatric: She has a normal  mood and affect. Her speech is normal and behavior is normal. Thought content normal.  Nursing note and vitals reviewed.   BP 138/82   Pulse 86   Ht 5' 7.5" (1.715 m)   Wt 170 lb (77.1 kg)   SpO2 98%   BMI 26.23 kg/m   Assessment and Plan: 1. Essential (primary) hypertension controlled  2. Gastroesophageal reflux disease, esophagitis presence not specified Continue prn  PPI Monitor dysphagia sx and follow up if worsening  3. H/O adenomatous polyp of colon May be due for follow up this year.   No orders of the defined types were placed in this encounter.   Partially dictated using Editor, commissioning. Any errors are unintentional.  Halina Maidens, MD [facility]  09/28/2017

## 2017-12-05 ENCOUNTER — Ambulatory Visit
Admission: RE | Admit: 2017-12-05 | Discharge: 2017-12-05 | Disposition: A | Discharge status: Home / Self Care | Payer: BC Managed Care – PPO | Source: Ambulatory Visit | Attending: Internal Medicine | Admitting: Internal Medicine

## 2017-12-05 DIAGNOSIS — Z1231 Encounter for screening mammogram for malignant neoplasm of breast: Secondary | ICD-10-CM | POA: Insufficient documentation

## 2018-01-03 ENCOUNTER — Other Ambulatory Visit: Payer: Self-pay | Admitting: Internal Medicine

## 2018-01-03 ENCOUNTER — Telehealth: Payer: Self-pay

## 2018-01-03 DIAGNOSIS — H00011 Hordeolum externum right upper eyelid: Secondary | ICD-10-CM

## 2018-01-03 MED ORDER — ERYTHROMYCIN 5 MG/GM OP OINT: 1.0000 "application " | TOPICAL_OINTMENT | Freq: Every day | OPHTHALMIC | 0 refills | Status: AC

## 2018-01-03 NOTE — Telephone Encounter (Signed)
Patient call stating she has a sty in her right eye and she is wanting to know you could send in a Rx abx for her eye. I advise patient she would need and OV she stated she is leaving out of town Friday and she would not be able to come in tomorrow for an OV.

## 2018-01-03 NOTE — Telephone Encounter (Signed)
Left a detailed message regarding patient use and courtesy refill on abx.

## 2018-01-03 NOTE — Telephone Encounter (Signed)
I sent in eye ointment.  Use it for 5 days.  Do not expect me to call in medication in the future since our protocol requires an office visit for new prescriptions. Use warm compresses three times a day. If no improvement, will need to see your eye doctor.

## 2018-01-05 NOTE — Telephone Encounter (Signed)
Patient picked up Rx and is doing much better after one day on abx

## 2018-04-04 ENCOUNTER — Encounter: Payer: Self-pay | Admitting: Internal Medicine

## 2018-04-04 ENCOUNTER — Ambulatory Visit (INDEPENDENT_AMBULATORY_CARE_PROVIDER_SITE_OTHER): Payer: BC Managed Care – PPO | Admitting: Internal Medicine

## 2018-04-04 VITALS — BP 108/78 | HR 90 | Ht 67.5 in | Wt 172.0 lb

## 2018-04-04 DIAGNOSIS — R002 Palpitations: Secondary | ICD-10-CM

## 2018-04-04 DIAGNOSIS — Z1239 Encounter for other screening for malignant neoplasm of breast: Secondary | ICD-10-CM

## 2018-04-04 DIAGNOSIS — Z1231 Encounter for screening mammogram for malignant neoplasm of breast: Secondary | ICD-10-CM

## 2018-04-04 DIAGNOSIS — E785 Hyperlipidemia, unspecified: Secondary | ICD-10-CM | POA: Diagnosis not present

## 2018-04-04 DIAGNOSIS — I10 Essential (primary) hypertension: Secondary | ICD-10-CM

## 2018-04-04 DIAGNOSIS — Z23 Encounter for immunization: Secondary | ICD-10-CM

## 2018-04-04 DIAGNOSIS — Z0000 Encounter for general adult medical examination without abnormal findings: Secondary | ICD-10-CM | POA: Diagnosis not present

## 2018-04-04 LAB — POCT URINALYSIS DIPSTICK
Bilirubin, UA: NEGATIVE
GLUCOSE UA: NEGATIVE
KETONES UA: NEGATIVE
NITRITE UA: NEGATIVE
PROTEIN UA: NEGATIVE
RBC UA: NEGATIVE
Urobilinogen, UA: 0.2 E.U./dL
pH, UA: 6.5 (ref 5.0–8.0)

## 2018-04-04 MED ORDER — ATORVASTATIN CALCIUM 20 MG PO TABS: 20.0000 mg | ORAL_TABLET | Freq: Every day | ORAL | 3 refills | Status: DC

## 2018-04-04 MED ORDER — ZOSTER VAC RECOMB ADJUVANTED 50 MCG/0.5ML IM SUSR: 0.5000 mL | Freq: Once | INTRAMUSCULAR | 1 refills | Status: AC

## 2018-04-04 MED ORDER — AMLODIPINE BESYLATE 10 MG PO TABS: 10.0000 mg | ORAL_TABLET | Freq: Every day | ORAL | 3 refills | Status: DC

## 2018-04-04 NOTE — Progress Notes (Signed)
Date:  04/04/2018   Name:  Lisa Haas   DOB:  11/17/1955   MRN:  [id number]   Chief Complaint: Annual Exam (Breast Exam. Pap in 3 more years unless sx. ) Lisa Haas is a 62 y.o. female who presents today for her Complete Annual Exam. She feels well. She reports exercising walking with work but no structured exercise. She reports she is sleeping fairly well. She denies complaints.  Recent mammogram was normal.  Pap due in 3 years, colonoscopy in 6 years.  Hypertension  This is a chronic problem. The problem is controlled. Pertinent negatives include no chest pain, headaches, palpitations or shortness of breath. Past treatments include calcium channel blockers. The current treatment provides significant improvement. There are no compliance problems.   Hyperlipidemia  This is a chronic problem. The problem is controlled. Pertinent negatives include no chest pain or shortness of breath. Current antihyperlipidemic treatment includes statins. The current treatment provides significant improvement of lipids.      Review of Systems  Constitutional: Negative for chills, fatigue and fever.  HENT: Negative for congestion, hearing loss, tinnitus, trouble swallowing and voice change.   Eyes: Negative for visual disturbance.  Respiratory: Negative for cough, chest tightness, shortness of breath and wheezing.   Cardiovascular: Negative for chest pain, palpitations and leg swelling.  Gastrointestinal: Negative for abdominal pain, constipation, diarrhea and vomiting.  Endocrine: Negative for polydipsia and polyuria.  Genitourinary: Negative for dysuria, frequency, genital sores, vaginal bleeding and vaginal discharge.  Musculoskeletal: Negative for arthralgias, gait problem and joint swelling.  Skin: Negative for color change and rash.  Neurological: Negative for dizziness, tremors, light-headedness and headaches.  Hematological: Negative for adenopathy. Does not bruise/bleed easily.    Psychiatric/Behavioral: Negative for dysphoric mood and sleep disturbance. The patient is not nervous/anxious.     Patient Active Problem List   Diagnosis Date Noted  . 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 Dyslipidemia 06/22/2015  . Essential (primary) hypertension 06/22/2015  . H/O adenomatous polyp of colon 06/22/2015  . Hot flash, menopausal 06/22/2015  . Heart palpitations 06/22/2015  . 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 Penicillins   . Sulfa Antibiotics     Other reaction(s): Headache    Past Surgical History:  Procedure Laterality Date  . BREAST BIOPSY Left 08/22/11   benign  . BREAST BIOPSY Left 07/05/11   lt stereo/clip-neg  . COLONOSCOPY  09/19/2012   at [facility] Dr. Alain Marion    Social History   Tobacco Use  . Smoking status: Former Research scientist (life sciences)  . Smokeless tobacco: Never Used  Substance Use Topics  . Alcohol use: Yes    Alcohol/week: 2.0 standard drinks    Types: 2 Standard drinks or equivalent per week  . Drug use: Yes    Types: Marijuana     Medication list has been reviewed and updated.  Current Meds  Medication Sig  . amLODipine (NORVASC) 10 MG tablet Take 1 tablet (10 mg total) by mouth daily.  Marland Kitchen atorvastatin (LIPITOR) 20 MG tablet Take 1 tablet (20 mg total) by mouth at bedtime.    PHQ 2/9 Scores 04/04/2018 03/27/2017 03/23/2016  PHQ - 2 Score 0 0 0  PHQ- 9 Score - - 3    Physical Exam  Constitutional: She is oriented to person, place, and time. She appears well-developed and well-nourished. No distress.  HENT:  Head: Normocephalic and atraumatic.  Right Ear: Tympanic membrane and ear canal normal.  Left Ear: Tympanic membrane and ear  canal normal.  Nose: Right sinus exhibits no maxillary sinus tenderness. Left sinus exhibits no maxillary sinus tenderness.  Mouth/Throat: Uvula is midline and oropharynx is clear and moist.  Eyes: Conjunctivae and EOM are normal. Right eye exhibits no discharge. Left eye exhibits no discharge. No  scleral icterus.  Neck: Normal range of motion. Carotid bruit is not present. No erythema present. No thyromegaly present.  Cardiovascular: Normal rate, regular rhythm, normal heart sounds and normal pulses.  Occasional extrasystoles are present.  Pulmonary/Chest: Effort normal. No respiratory distress. She has no wheezes. Right breast exhibits no mass, no nipple discharge, no skin change and no tenderness. Left breast exhibits no mass, no nipple discharge, no skin change and no tenderness.  Abdominal: Soft. Bowel sounds are normal. There is no hepatosplenomegaly. There is no tenderness. There is no CVA tenderness.  Musculoskeletal: Normal range of motion.  Lymphadenopathy:    She has no cervical adenopathy.    She has no axillary adenopathy.  Neurological: She is alert and oriented to person, place, and time. She has normal reflexes. No cranial nerve deficit or sensory deficit.  Skin: Skin is warm, dry and intact. No rash noted.  Psychiatric: She has a normal mood and affect. Her speech is normal and behavior is normal. Thought content normal.  Nursing note and vitals reviewed.   BP 108/78 (BP Location: Right Arm, Patient Position: Sitting, Cuff Size: Normal)   Pulse 90   Ht 5' 7.5" (1.715 m)   Wt 172 lb (78 kg)   SpO2 99%   BMI 26.54 kg/m   Assessment and Plan: 1. Annual physical exam Continue healthy diet, exercise - POCT urinalysis dipstick  2. Breast cancer screening Mammogram done  3. Essential (primary) hypertension controlled - CBC with Differential/Platelet - Comprehensive metabolic panel - TSH - amLODipine (NORVASC) 10 MG tablet; Take 1 tablet (10 mg total) by mouth daily.  Dispense: 90 tablet; Refill: 3  4. Dyslipidemia On statin therapy - Lipid panel - atorvastatin (LIPITOR) 20 MG tablet; Take 1 tablet (20 mg total) by mouth at bedtime.  Dispense: 90 tablet; Refill: 3  5. Need for shingles vaccine - Zoster Vaccine Adjuvanted [facility]) injection; Inject 0.5 mLs  into the muscle once for 1 dose.  Dispense: 0.5 mL; Refill: 1  6. Heart palpitations Minimally asx - no treatment needed   Meds ordered this encounter  Medications  . Zoster Vaccine Adjuvanted [facility]) injection    Sig: Inject 0.5 mLs into the muscle once for 1 dose.    Dispense:  0.5 mL    Refill:  1  . amLODipine (NORVASC) 10 MG tablet    Sig: Take 1 tablet (10 mg total) by mouth daily.    Dispense:  90 tablet    Refill:  3  . atorvastatin (LIPITOR) 20 MG tablet    Sig: Take 1 tablet (20 mg total) by mouth at bedtime.    Dispense:  90 tablet    Refill:  3    Partially dictated using Editor, commissioning. Any errors are unintentional.  Halina Maidens, MD [facility]  04/04/2018

## 2018-04-05 LAB — COMPREHENSIVE METABOLIC PANEL
A/G RATIO: 2.2 (ref 1.2–2.2)
ALBUMIN: 5 g/dL — AB (ref 3.6–4.8)
ALK PHOS: 79 IU/L (ref 39–117)
ALT: 20 IU/L (ref 0–32)
AST: 22 IU/L (ref 0–40)
BILIRUBIN TOTAL: 0.4 mg/dL (ref 0.0–1.2)
BUN / CREAT RATIO: 18 (ref 12–28)
BUN: 13 mg/dL (ref 8–27)
CHLORIDE: 102 mmol/L (ref 96–106)
CO2: 24 mmol/L (ref 20–29)
Calcium: 9.6 mg/dL (ref 8.7–10.3)
Creatinine, Ser: 0.73 mg/dL (ref 0.57–1.00)
GFR calc non Af Amer: 90 mL/min/{1.73_m2} (ref >59)
GFR, EST AFRICAN AMERICAN: 104 mL/min/{1.73_m2} (ref >59)
GLOBULIN, TOTAL: 2.3 g/dL (ref 1.5–4.5)
Glucose: 95 mg/dL (ref 65–99)
Potassium: 4.5 mmol/L (ref 3.5–5.2)
SODIUM: 142 mmol/L (ref 134–144)
TOTAL PROTEIN: 7.3 g/dL (ref 6.0–8.5)

## 2018-04-05 LAB — CBC WITH DIFFERENTIAL/PLATELET
BASOS: 1 %
Basophils Absolute: 0 10*3/uL (ref 0.0–0.2)
EOS (ABSOLUTE): 0.1 10*3/uL (ref 0.0–0.4)
EOS: 2 %
HEMATOCRIT: 43.7 % (ref 34.0–46.6)
HEMOGLOBIN: 14.3 g/dL (ref 11.1–15.9)
IMMATURE GRANS (ABS): 0 10*3/uL (ref 0.0–0.1)
Immature Granulocytes: 0 %
LYMPHS ABS: 1.8 10*3/uL (ref 0.7–3.1)
LYMPHS: 25 %
MCH: 29.2 pg (ref 26.6–33.0)
MCHC: 32.7 g/dL (ref 31.5–35.7)
MCV: 89 fL (ref 79–97)
MONOCYTES: 8 %
Monocytes Absolute: 0.6 10*3/uL (ref 0.1–0.9)
NEUTROS ABS: 4.5 10*3/uL (ref 1.4–7.0)
Neutrophils: 64 %
Platelets: 255 10*3/uL (ref 150–450)
RBC: 4.9 x10E6/uL (ref 3.77–5.28)
RDW: 11.9 % — ABNORMAL LOW (ref 12.3–15.4)
WBC: 7.1 10*3/uL (ref 3.4–10.8)

## 2018-04-05 LAB — LIPID PANEL
Chol/HDL Ratio: 3 ratio (ref 0.0–4.4)
Cholesterol, Total: 224 mg/dL — ABNORMAL HIGH (ref 100–199)
HDL: 74 mg/dL (ref >39)
LDL Calculated: 119 mg/dL — ABNORMAL HIGH (ref 0–99)
Triglycerides: 153 mg/dL — ABNORMAL HIGH (ref 0–149)
VLDL CHOLESTEROL CAL: 31 mg/dL (ref 5–40)

## 2018-04-05 LAB — TSH: TSH: 1.96 u[IU]/mL (ref 0.450–4.500)

## 2018-10-01 ENCOUNTER — Other Ambulatory Visit: Payer: Self-pay

## 2018-10-01 DIAGNOSIS — Z1211 Encounter for screening for malignant neoplasm of colon: Secondary | ICD-10-CM

## 2018-10-03 ENCOUNTER — Ambulatory Visit: Payer: BC Managed Care – PPO | Admitting: Internal Medicine

## 2018-10-03 DIAGNOSIS — K641 Second degree hemorrhoids: Secondary | ICD-10-CM | POA: Insufficient documentation

## 2018-10-05 ENCOUNTER — Ambulatory Visit: Payer: BC Managed Care – PPO | Admitting: Internal Medicine

## 2018-10-16 LAB — HM COLONOSCOPY

## 2018-12-11 ENCOUNTER — Encounter: Payer: Self-pay | Admitting: Internal Medicine

## 2018-12-11 ENCOUNTER — Other Ambulatory Visit: Payer: Self-pay

## 2018-12-11 MED ORDER — OMEPRAZOLE 20 MG PO CPDR: 20.0000 mg | DELAYED_RELEASE_CAPSULE | Freq: Every day | ORAL | 0 refills | Status: DC

## 2018-12-11 NOTE — Progress Notes (Signed)
prilo

## 2019-03-12 ENCOUNTER — Other Ambulatory Visit: Payer: Self-pay | Admitting: Internal Medicine

## 2019-03-13 ENCOUNTER — Encounter: Payer: Self-pay | Admitting: Internal Medicine

## 2019-03-13 MED ORDER — OMEPRAZOLE 20 MG PO CPDR: 20.0000 mg | DELAYED_RELEASE_CAPSULE | Freq: Every day | ORAL | 0 refills | Status: DC

## 2019-04-07 ENCOUNTER — Encounter: Payer: Self-pay | Admitting: Internal Medicine

## 2019-04-08 ENCOUNTER — Other Ambulatory Visit: Payer: Self-pay

## 2019-04-08 ENCOUNTER — Ambulatory Visit (INDEPENDENT_AMBULATORY_CARE_PROVIDER_SITE_OTHER): Payer: BC Managed Care – PPO | Admitting: Internal Medicine

## 2019-04-08 ENCOUNTER — Encounter: Payer: Self-pay | Admitting: Internal Medicine

## 2019-04-08 VITALS — BP 128/76 | HR 89 | Ht 67.0 in | Wt 178.0 lb

## 2019-04-08 DIAGNOSIS — E785 Hyperlipidemia, unspecified: Secondary | ICD-10-CM

## 2019-04-08 DIAGNOSIS — K219 Gastro-esophageal reflux disease without esophagitis: Secondary | ICD-10-CM | POA: Diagnosis not present

## 2019-04-08 DIAGNOSIS — Z86010 Personal history of colonic polyps: Secondary | ICD-10-CM | POA: Diagnosis not present

## 2019-04-08 DIAGNOSIS — Z23 Encounter for immunization: Secondary | ICD-10-CM | POA: Diagnosis not present

## 2019-04-08 DIAGNOSIS — I10 Essential (primary) hypertension: Secondary | ICD-10-CM | POA: Diagnosis not present

## 2019-04-08 DIAGNOSIS — Z0000 Encounter for general adult medical examination without abnormal findings: Secondary | ICD-10-CM | POA: Diagnosis not present

## 2019-04-08 DIAGNOSIS — Z1231 Encounter for screening mammogram for malignant neoplasm of breast: Secondary | ICD-10-CM

## 2019-04-08 LAB — POCT URINALYSIS DIPSTICK
Bilirubin, UA: NEGATIVE
Blood, UA: NEGATIVE
Glucose, UA: NEGATIVE
Ketones, UA: NEGATIVE
Nitrite, UA: NEGATIVE
Protein, UA: NEGATIVE
Spec Grav, UA: 1.02 (ref 1.010–1.025)
Urobilinogen, UA: 0.2 E.U./dL
pH, UA: 6 (ref 5.0–8.0)

## 2019-04-08 MED ORDER — AMLODIPINE BESYLATE 10 MG PO TABS: 10.0000 mg | ORAL_TABLET | Freq: Every day | ORAL | 3 refills | Status: DC

## 2019-04-08 MED ORDER — ATORVASTATIN CALCIUM 20 MG PO TABS: 20.0000 mg | ORAL_TABLET | Freq: Every day | ORAL | 3 refills | Status: DC

## 2019-04-08 MED ORDER — OMEPRAZOLE 20 MG PO CPDR: 20.0000 mg | DELAYED_RELEASE_CAPSULE | Freq: Every day | ORAL | 3 refills | Status: DC

## 2019-04-08 NOTE — Patient Instructions (Signed)

## 2019-04-08 NOTE — Progress Notes (Signed)
Date:  04/08/2019   Name:  Lisa Haas   DOB:  1956-07-04   MRN:  [id number]   Chief Complaint: Annual Exam (Breast Exam.) and Immunizations (Flu Shot.) PHYLLYS Haas is a 63 y.o. female who presents today for her Complete Annual Exam. She feels well. She reports exercising none for a while but is starting back walking. She reports she is sleeping fairly well.   Mammogram 12/2017 Colonoscopy 10/2018 repeat 5 years Pap - 2017   Hypertension This is a chronic problem. The problem is controlled (at home 120/80). Pertinent negatives include no chest pain, headaches, palpitations or shortness of breath. There are no associated agents to hypertension. Past treatments include calcium channel blockers. The current treatment provides significant improvement.  Hyperlipidemia The problem is controlled. Pertinent negatives include no chest pain or shortness of breath. Current antihyperlipidemic treatment includes statins. The current treatment provides significant improvement of lipids. There are no compliance problems.   Gastroesophageal Reflux She complains of heartburn and water brash. She reports no abdominal pain, no chest pain, no coughing or no wheezing. This is a new problem. The problem occurs rarely. The problem has been rapidly improving. The heartburn does not wake her from sleep. The symptoms are aggravated by certain foods and lying down. Pertinent negatives include no fatigue. There are no known risk factors. She has tried a PPI for the symptoms. The treatment provided significant relief.   Lab Results  Component Value Date   CREATININE 0.73 04/04/2018   BUN 13 04/04/2018   NA 142 04/04/2018   K 4.5 04/04/2018   CL 102 04/04/2018   CO2 24 04/04/2018   Lab Results  Component Value Date   CHOL 224 (H) 04/04/2018   HDL 74 04/04/2018   LDLCALC 119 (H) 04/04/2018   TRIG 153 (H) 04/04/2018   CHOLHDL 3.0 04/04/2018     Review of Systems  Constitutional: Negative for chills,  fatigue and fever.  HENT: Negative for congestion, hearing loss, tinnitus, trouble swallowing and voice change.   Eyes: Negative for visual disturbance.  Respiratory: Negative for cough, chest tightness, shortness of breath and wheezing.   Cardiovascular: Negative for chest pain, palpitations and leg swelling.  Gastrointestinal: Positive for heartburn. Negative for abdominal pain, constipation, diarrhea and vomiting.  Endocrine: Negative for polydipsia and polyuria.  Genitourinary: Negative for dysuria, frequency, genital sores, vaginal bleeding and vaginal discharge.  Musculoskeletal: Negative for arthralgias, gait problem and joint swelling.  Skin: Negative for color change and rash.  Allergic/Immunologic: Negative for environmental allergies.  Neurological: Negative for dizziness, tremors, light-headedness and headaches.  Hematological: Negative for adenopathy. Does not bruise/bleed easily.  Psychiatric/Behavioral: Negative for dysphoric mood and sleep disturbance. The patient is not nervous/anxious.     Patient Active Problem List   Diagnosis Date Noted  . Grade II hemorrhoids 10/03/2018  . 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 Dyslipidemia 06/22/2015  . Essential (primary) hypertension 06/22/2015  . History of adenomatous polyp of colon 06/22/2015  . Hot flash, menopausal 06/22/2015  . Heart palpitations 06/22/2015  . 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 Penicillins   . Sulfa Antibiotics     Other reaction(s): Headache    Past Surgical History:  Procedure Laterality Date  . BREAST BIOPSY Left 08/22/11   benign  . BREAST BIOPSY Left 07/05/11   lt stereo/clip-neg  . COLONOSCOPY  09/19/2012   at [facility] Dr. Alain Marion    Social History   Tobacco Use  . Smoking status:  Former Smoker  . Smokeless tobacco: Never Used  Substance Use Topics  . Alcohol use: Yes    Alcohol/week: 2.0 standard drinks    Types: 2 Standard drinks or equivalent per week  . Drug use:  Yes    Types: Marijuana     Medication list has been reviewed and updated.  Current Meds  Medication Sig  . amLODipine (NORVASC) 10 MG tablet Take 1 tablet (10 mg total) by mouth daily.  Marland Kitchen atorvastatin (LIPITOR) 20 MG tablet Take 1 tablet (20 mg total) by mouth at bedtime.  . Multiple Vitamins-Minerals (MULTIVITAMIN WITH MINERALS) tablet Take 1 tablet by mouth daily.  Marland Kitchen omeprazole (PRILOSEC) 20 MG capsule Take 1 capsule (20 mg total) by mouth daily.    PHQ 2/9 Scores 04/08/2019 04/04/2018 03/27/2017 03/23/2016  PHQ - 2 Score 0 0 0 0  PHQ- 9 Score - - - 3    BP Readings from Last 3 Encounters:  04/08/19 128/76  04/04/18 108/78  09/28/17 132/78    Physical Exam Vitals signs and nursing note reviewed.  Constitutional:      General: She is not in acute distress.    Appearance: She is well-developed.  HENT:     Head: Normocephalic and atraumatic.     Right Ear: Tympanic membrane and ear canal normal.     Left Ear: Tympanic membrane and ear canal normal.     Nose:     Right Sinus: No maxillary sinus tenderness.     Left Sinus: No maxillary sinus tenderness.  Eyes:     General: No scleral icterus.       Right eye: No discharge.        Left eye: No discharge.     Conjunctiva/sclera: Conjunctivae normal.  Neck:     Musculoskeletal: Normal range of motion. No erythema.     Thyroid: No thyromegaly.     Vascular: No carotid bruit.  Cardiovascular:     Rate and Rhythm: Normal rate and regular rhythm.     Pulses: Normal pulses.     Heart sounds: Normal heart sounds.  Pulmonary:     Effort: Pulmonary effort is normal. No respiratory distress.     Breath sounds: No wheezing.  Chest:     Breasts:        Right: No mass, nipple discharge, skin change or tenderness.        Left: No mass, nipple discharge, skin change or tenderness.  Abdominal:     General: Bowel sounds are normal.     Palpations: Abdomen is soft.     Tenderness: There is no abdominal tenderness.   Musculoskeletal: Normal range of motion.  Lymphadenopathy:     Cervical: No cervical adenopathy.  Skin:    General: Skin is warm and dry.     Capillary Refill: Capillary refill takes less than 2 seconds.     Findings: No rash.  Neurological:     General: No focal deficit present.     Mental Status: She is alert and oriented to person, place, and time.     Cranial Nerves: No cranial nerve deficit.     Sensory: No sensory deficit.     Deep Tendon Reflexes: Reflexes are normal and symmetric.  Psychiatric:        Speech: Speech normal.        Behavior: Behavior normal.        Thought Content: Thought content normal.     Wt Readings from Last 3 Encounters:  04/08/19 178  lb (80.7 kg)  04/04/18 172 lb (78 kg)  09/28/17 170 lb (77.1 kg)    BP 128/76   Pulse 89   Ht 5\' 7"  (1.702 m)   Wt 178 lb (80.7 kg)   SpO2 96%   BMI 27.88 kg/m   Assessment and Plan: 1. Annual physical exam Normal exam Darkly tanned skin with multiple freckles - recommend dermatology evaluation/skin survey Continue healthy diet, resume regular exercise - POCT urinalysis dipstick  2. Encounter for screening mammogram for breast cancer Pt to schedule at [place]; Future  3. Essential (primary) hypertension Clinically stable exam with well controlled BP.   Tolerating medications, amlodopine, without side effects at this time. Pt to continue current regimen and low sodium diet; benefits of regular exercise as able discussed. - CBC with Differential/Platelet - Comprehensive metabolic panel - TSH - amLODipine (NORVASC) 10 MG tablet; Take 1 tablet (10 mg total) by mouth daily.  Dispense: 90 tablet; Refill: 3  4. Dyslipidemia Tolerating statin medication without side effects at this time LDL is at goal of < 100 on current dose Continue same therapy without change at this time. - Lipid panel - atorvastatin (LIPITOR) 20 MG tablet; Take 1 tablet (20 mg total) by mouth at bedtime.   Dispense: 90 tablet; Refill: 3  5. H/O adenomatous polyp of colon Recent colonoscopy Repeat 5 yrs  6. GERD without esophagitis Symptoms well controlled on daily PPI No red flag signs such as weight loss, n/v, melena Will continue omeprazole daily. - omeprazole (PRILOSEC) 20 MG capsule; Take 1 capsule (20 mg total) by mouth daily.  Dispense: 90 capsule; Refill: 3   Partially dictated using Editor, commissioning. Any errors are unintentional.  Halina Maidens, MD [facility]  04/08/2019

## 2019-04-09 ENCOUNTER — Encounter: Payer: Self-pay | Admitting: Internal Medicine

## 2019-04-09 LAB — COMPREHENSIVE METABOLIC PANEL
ALT: 25 IU/L (ref 0–32)
AST: 25 IU/L (ref 0–40)
Albumin/Globulin Ratio: 2.2 (ref 1.2–2.2)
Albumin: 4.9 g/dL — ABNORMAL HIGH (ref 3.8–4.8)
Alkaline Phosphatase: 89 IU/L (ref 39–117)
BUN/Creatinine Ratio: 18 (ref 12–28)
BUN: 16 mg/dL (ref 8–27)
Bilirubin Total: 0.7 mg/dL (ref 0.0–1.2)
CO2: 19 mmol/L — ABNORMAL LOW (ref 20–29)
Calcium: 9.6 mg/dL (ref 8.7–10.3)
Chloride: 101 mmol/L (ref 96–106)
Creatinine, Ser: 0.91 mg/dL (ref 0.57–1.00)
GFR calc Af Amer: 79 mL/min/{1.73_m2} (ref >59)
GFR calc non Af Amer: 68 mL/min/{1.73_m2} (ref >59)
Globulin, Total: 2.2 g/dL (ref 1.5–4.5)
Glucose: 107 mg/dL — ABNORMAL HIGH (ref 65–99)
Potassium: 4.1 mmol/L (ref 3.5–5.2)
Sodium: 140 mmol/L (ref 134–144)
Total Protein: 7.1 g/dL (ref 6.0–8.5)

## 2019-04-09 LAB — CBC WITH DIFFERENTIAL/PLATELET
Basophils Absolute: 0 10*3/uL (ref 0.0–0.2)
Basos: 1 %
EOS (ABSOLUTE): 0.1 10*3/uL (ref 0.0–0.4)
Eos: 2 %
Hematocrit: 43.5 % (ref 34.0–46.6)
Hemoglobin: 14.3 g/dL (ref 11.1–15.9)
Immature Grans (Abs): 0 10*3/uL (ref 0.0–0.1)
Immature Granulocytes: 0 %
Lymphocytes Absolute: 1.6 10*3/uL (ref 0.7–3.1)
Lymphs: 26 %
MCH: 29.1 pg (ref 26.6–33.0)
MCHC: 32.9 g/dL (ref 31.5–35.7)
MCV: 89 fL (ref 79–97)
Monocytes Absolute: 0.4 10*3/uL (ref 0.1–0.9)
Monocytes: 7 %
Neutrophils Absolute: 4.1 10*3/uL (ref 1.4–7.0)
Neutrophils: 64 %
Platelets: 242 10*3/uL (ref 150–450)
RBC: 4.91 x10E6/uL (ref 3.77–5.28)
RDW: 11.9 % (ref 11.7–15.4)
WBC: 6.3 10*3/uL (ref 3.4–10.8)

## 2019-04-09 LAB — LIPID PANEL
Chol/HDL Ratio: 3.5 ratio (ref 0.0–4.4)
Cholesterol, Total: 246 mg/dL — ABNORMAL HIGH (ref 100–199)
HDL: 70 mg/dL (ref >39)
LDL Chol Calc (NIH): 138 mg/dL — ABNORMAL HIGH (ref 0–99)
Triglycerides: 213 mg/dL — ABNORMAL HIGH (ref 0–149)
VLDL Cholesterol Cal: 38 mg/dL (ref 5–40)

## 2019-04-09 LAB — TSH: TSH: 1.73 u[IU]/mL (ref 0.450–4.500)

## 2019-04-25 ENCOUNTER — Ambulatory Visit
Admission: RE | Admit: 2019-04-25 | Discharge: 2019-04-25 | Disposition: A | Discharge status: Home / Self Care | Payer: BC Managed Care – PPO | Source: Ambulatory Visit | Attending: Internal Medicine | Admitting: Internal Medicine

## 2019-04-25 ENCOUNTER — Other Ambulatory Visit: Payer: Self-pay

## 2019-04-25 DIAGNOSIS — Z1231 Encounter for screening mammogram for malignant neoplasm of breast: Secondary | ICD-10-CM | POA: Diagnosis present

## 2019-04-25 HISTORY — DX: Malignant (primary) neoplasm, unspecified: C80.1

## 2019-06-27 ENCOUNTER — Ambulatory Visit
Admission: EM | Admit: 2019-06-27 | Discharge: 2019-06-27 | Disposition: A | Discharge status: Home / Self Care | Payer: BC Managed Care – PPO | Attending: Family Medicine | Admitting: Family Medicine

## 2019-06-27 ENCOUNTER — Encounter: Payer: Self-pay | Admitting: Emergency Medicine

## 2019-06-27 ENCOUNTER — Ambulatory Visit: Payer: BC Managed Care – PPO

## 2019-06-27 ENCOUNTER — Other Ambulatory Visit: Payer: Self-pay

## 2019-06-27 DIAGNOSIS — S93401A Sprain of unspecified ligament of right ankle, initial encounter: Secondary | ICD-10-CM

## 2019-06-27 DIAGNOSIS — W1849XA Other slipping, tripping and stumbling without falling, initial encounter: Secondary | ICD-10-CM | POA: Diagnosis not present

## 2019-06-27 DIAGNOSIS — M25571 Pain in right ankle and joints of right foot: Secondary | ICD-10-CM

## 2019-06-27 HISTORY — DX: Hyperlipidemia, unspecified: E78.5

## 2019-06-27 HISTORY — DX: Essential (primary) hypertension: I10

## 2019-06-27 NOTE — ED Notes (Signed)
Ankle splint applied to right ankle per L. Sabra Heck, NP

## 2019-06-27 NOTE — Discharge Instructions (Addendum)
Ice. Elevate.  Over-the-counter ibuprofen or Tylenol as needed.  Gradually increase weightbearing as tolerated.  Monitor.  Follow-up with primary care or return to urgent care for continued pain and recommend imaging at that time.

## 2019-06-27 NOTE — ED Triage Notes (Signed)
Patient in today c/o right ankle pain and swelling since last night. Patient states she got up off the couch and her right ankle rolled out. Patient has not iced or taken any OTC medications.

## 2019-06-27 NOTE — ED Provider Notes (Addendum)
[facility] ____________________________________________  Time seen: Approximately 12:48 PM  I have reviewed the triage vital signs and the nursing notes.   HISTORY  Chief Complaint Ankle Pain (right)   HPI Lisa Haas is a 63 y.o. female present for evaluation of right ankle pain.  Patient reports last night she was getting up off the couch and accidentally inverted and rolled her ankle.  Reports some pain since.  Reports she has remained ambulatory.Reports good range of motion present still, no pain radiation or paresthesias.  Denies injury to the same area in the past.  Has applied ice.  Denies other alleviating measures.  Pain mild currently. Reports otherwise doing well.   Glean Hess, MD : PCP   Past Medical History:  Diagnosis Date  . Cancer ([place])    skin ca  . Hyperlipidemia   . Hypertension     Patient Active Problem List   Diagnosis Date Noted  . GERD without esophagitis 04/08/2019  . Grade II hemorrhoids 10/03/2018  . 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 Dyslipidemia 06/22/2015  . Essential (primary) hypertension 06/22/2015  . History of adenomatous polyp of colon 06/22/2015  . Hot flash, menopausal 06/22/2015  . Heart palpitations 06/22/2015  . Achrochordon 06/22/2015    Past Surgical History:  Procedure Laterality Date  . BREAST BIOPSY Left 08/22/11   benign  . BREAST BIOPSY Left 07/05/11   lt stereo/clip-neg  . COLONOSCOPY  09/19/2012   at [facility] Dr. Alain Marion     No current facility-administered medications for this encounter.   Current Outpatient Medications:  .  amLODipine (NORVASC) 10 MG tablet, Take 1 tablet (10 mg total) by mouth daily., Disp: 90 tablet, Rfl: 3 .  Aspirin-Acetaminophen-Caffeine (GOODYS EXTRA STRENGTH PO), Take 1 tablet by mouth daily., Disp: , Rfl:  .  atorvastatin (LIPITOR) 20 MG tablet, Take 1 tablet (20 mg total) by mouth at bedtime., Disp: 90 tablet, Rfl: 3 .  Multiple Vitamins-Minerals (MULTIVITAMIN WITH  MINERALS) tablet, Take 1 tablet by mouth daily., Disp: , Rfl:  .  omeprazole (PRILOSEC) 20 MG capsule, Take 1 capsule (20 mg total) by mouth daily., Disp: 90 capsule, Rfl: 3  Allergies Penicillins and Sulfa antibiotics  Family History  Problem Relation Age of Onset  . Breast cancer Mother 5  . Hyperlipidemia Mother   . Osteoarthritis Mother   . Rheum arthritis Mother   . Lung cancer Father 40  . Breast cancer Maternal Aunt        mat great aunt    Social History Social History   Tobacco Use  . Smoking status: Former Smoker    Quit date: 06/26/2009    Years since quitting: 10.0  . Smokeless tobacco: Never Used  Substance Use Topics  . Alcohol use: Yes    Alcohol/week: 4.0 standard drinks    Types: 4 Cans of beer per week    Comment: "2-6 beers per week"  . Drug use: Yes    Types: Marijuana    Comment: "a couple of times per month"    Review of Systems Constitutional: No fever Cardiovascular: Denies chest pain. Respiratory: Denies shortness of breath. Musculoskeletal: Positive right ankle pain. Skin: Negative for rash.   ____________________________________________   PHYSICAL EXAM:  VITAL SIGNS: ED Triage Vitals  Enc Vitals Group     BP 06/27/19 1156 129/82     Pulse Rate 06/27/19 1156 92     Resp 06/27/19 1156 16     Temp 06/27/19 1156 99 F (37.2 C)  Temp Source 06/27/19 1156 Oral     SpO2 06/27/19 1156 97 %     Weight 06/27/19 1157 170 lb (77.1 kg)     Height 06/27/19 1157 5\' 7"  (1.702 m)     Head Circumference --      Peak Flow --      Pain Score 06/27/19 1157 6     Pain Loc --      Pain Edu? --      Excl. in [place]? --     Constitutional: Alert and oriented. Well appearing and in no acute distress. ENT      Head: Normocephalic and atraumatic. Cardiovascular:  Good peripheral circulation. Respiratory: Normal respiratory effort without tachypnea nor retractions.  Musculoskeletal:  Bilateral pedal pulses equal and easily palpated.   Except:  Right ankle minimal tenderness anterior to lateral malleolus at ATFL ligament, no swelling, able to fully plantarflex dorsiflex as well as rotate, right ankle and right foot no bony tenderness, no ecchymosis, normal sensation capillary refill. Neurologic:  Normal speech and language. Skin:  Skin is warm, dry and intact. No rash noted. Psychiatric: Mood and affect are normal. Speech and behavior are normal. Patient exhibits appropriate insight and judgment   ___________________________________________   LABS (all labs ordered are listed, but only abnormal results are displayed)   RADIOLOGY  No results found. ____________________________________________   PROCEDURES Procedures    INITIAL IMPRESSION / ASSESSMENT AND PLAN / ED COURSE  Pertinent labs & imaging results that were available during my care of the patient were reviewed by me and considered in my medical decision making (see chart for details).  Well-appearing patient.  No acute distress.  Right ankle pain after getting up from couch and rolling.  No point bony tenderness, no swelling and has remained ambulatory.  Suspect sprain injury.  Discussed use of supportive care, ASO brace, ibuprofen, ice and gradual increase as tolerated versus x-ray at this time.  Discussed with patient, will defer x-ray at this time, patient agrees and states she would like to hold off.  Supportive care, monitor and return for imaging if symptoms persist.  Discussed follow up with Primary care physician this week as needed. Discussed follow up and return parameters including no resolution or any worsening concerns. Patient verbalized understanding and agreed to plan.   ____________________________________________   FINAL CLINICAL IMPRESSION(S) / ED DIAGNOSES  Final diagnoses:  Sprain of right ankle, unspecified ligament, initial encounter  Acute right ankle pain     ED Discharge Orders    None       Note: This dictation was prepared  with Dragon dictation along with smaller phrase technology. Any transcriptional errors that result from this process are unintentional.         Marylene Land, NP 06/27/19 1303

## 2019-08-08 ENCOUNTER — Encounter: Payer: Self-pay | Admitting: Emergency Medicine

## 2019-08-08 ENCOUNTER — Ambulatory Visit
Admission: EM | Admit: 2019-08-08 | Discharge: 2019-08-08 | Disposition: A | Discharge status: Home / Self Care | Payer: BC Managed Care – PPO | Attending: Emergency Medicine | Admitting: Emergency Medicine

## 2019-08-08 ENCOUNTER — Other Ambulatory Visit: Payer: Self-pay

## 2019-08-08 ENCOUNTER — Ambulatory Visit (INDEPENDENT_AMBULATORY_CARE_PROVIDER_SITE_OTHER): Payer: BC Managed Care – PPO

## 2019-08-08 DIAGNOSIS — W1849XA Other slipping, tripping and stumbling without falling, initial encounter: Secondary | ICD-10-CM | POA: Diagnosis not present

## 2019-08-08 DIAGNOSIS — M79671 Pain in right foot: Secondary | ICD-10-CM | POA: Diagnosis not present

## 2019-08-08 DIAGNOSIS — M25571 Pain in right ankle and joints of right foot: Secondary | ICD-10-CM | POA: Diagnosis not present

## 2019-08-08 DIAGNOSIS — S92154A Nondisplaced avulsion fracture (chip fracture) of right talus, initial encounter for closed fracture: Secondary | ICD-10-CM | POA: Diagnosis not present

## 2019-08-08 MED ORDER — TRAMADOL HCL 50 MG PO TABS: 50.0000 mg | ORAL_TABLET | Freq: Four times a day (QID) | ORAL | 0 refills | Status: DC | PRN

## 2019-08-08 NOTE — Discharge Instructions (Addendum)
Keep in splint.  Ice.  Elevate.  Crutches.  Follow-up with podiatry this week as discussed.  Follow up with your primary care physician this week as needed. Return to Urgent care for new or worsening concerns.

## 2019-08-08 NOTE — ED Provider Notes (Signed)
[facility] ____________________________________________  Time seen: Approximately 9:37 AM  I have reviewed the triage vital signs and the nursing notes.   HISTORY  Chief Complaint Ankle Pain and Foot Pain   HPI Lisa Haas is a 63 y.o. female presenting for evaluation of right foot and ankle pain.  Patient reports last night she rolled her right ankle causing pain and swelling.  Reports just over a month ago she had similar happen and had pain only to her ankle.  Reports the swelling from that injury had resolved but still had some pain with particular movements.  States had been overall wearing her ASO brace she was given.  Deferred x-rays previously, patient reports she delayed having refollow-up as she just suspected it was a sprain.  Denies pain radiation, paresthesias or other injuries.  Reports otherwise doing well.  No recent sickness.  States having pain weightbearing at this time.  Glean Hess, MD : PCP     Past Medical History:  Diagnosis Date  . Cancer ([place])    skin ca  . Hyperlipidemia   . Hypertension     Patient Active Problem List   Diagnosis Date Noted  . GERD without esophagitis 04/08/2019  . Grade II hemorrhoids 10/03/2018  . 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 Dyslipidemia 06/22/2015  . Essential (primary) hypertension 06/22/2015  . History of adenomatous polyp of colon 06/22/2015  . Hot flash, menopausal 06/22/2015  . Heart palpitations 06/22/2015  . Achrochordon 06/22/2015    Past Surgical History:  Procedure Laterality Date  . BREAST BIOPSY Left 08/22/11   benign  . BREAST BIOPSY Left 07/05/11   lt stereo/clip-neg  . COLONOSCOPY  09/19/2012   at [facility] Dr. Alain Marion     No current facility-administered medications for this encounter.  Current Outpatient Medications:  .  amLODipine (NORVASC) 10 MG tablet, Take 1 tablet (10 mg total) by mouth daily., Disp: 90 tablet, Rfl: 3 .  Aspirin-Acetaminophen-Caffeine (GOODYS EXTRA STRENGTH  PO), Take 1 tablet by mouth daily., Disp: , Rfl:  .  atorvastatin (LIPITOR) 20 MG tablet, Take 1 tablet (20 mg total) by mouth at bedtime., Disp: 90 tablet, Rfl: 3 .  Multiple Vitamins-Minerals (MULTIVITAMIN WITH MINERALS) tablet, Take 1 tablet by mouth daily., Disp: , Rfl:  .  omeprazole (PRILOSEC) 20 MG capsule, Take 1 capsule (20 mg total) by mouth daily., Disp: 90 capsule, Rfl: 3 .  traMADol (ULTRAM) 50 MG tablet, Take 1 tablet (50 mg total) by mouth every 6 (six) hours as needed for moderate pain or severe pain., Disp: 12 tablet, Rfl: 0  Allergies Penicillins and Sulfa antibiotics  Family History  Problem Relation Age of Onset  . Breast cancer Mother 50  . Hyperlipidemia Mother   . Osteoarthritis Mother   . Rheum arthritis Mother   . Lung cancer Father 56  . Breast cancer Maternal Aunt        mat great aunt    Social History Social History   Tobacco Use  . Smoking status: Former Smoker    Quit date: 06/26/2009    Years since quitting: 10.1  . Smokeless tobacco: Never Used  Substance Use Topics  . Alcohol use: Yes    Alcohol/week: 4.0 standard drinks    Types: 4 Cans of beer per week    Comment: "2-6 beers per week"  . Drug use: Yes    Types: Marijuana    Comment: "a couple of times per month"    Review of Systems Constitutional: No fever  ENT: No sore throat. Cardiovascular: Denies chest pain. Respiratory: Denies shortness of breath. Musculoskeletal: Positive right foot and ankle pain. Skin: Negative for rash. Neurological: Negative for  focal weakness or numbness.  ____________________________________________   PHYSICAL EXAM:  VITAL SIGNS: ED Triage Vitals  Enc Vitals Group     BP 08/08/19 0914 127/78     Pulse Rate 08/08/19 0914 88     Resp 08/08/19 0914 18     Temp 08/08/19 0914 98.5 F (36.9 C)     Temp Source 08/08/19 0914 Oral     SpO2 08/08/19 0914 97 %     Weight 08/08/19 0912 170 lb (77.1 kg)     Height 08/08/19 0912 5\' 7"  (1.702 m)      Head Circumference --      Peak Flow --      Pain Score 08/08/19 0912 8     Pain Loc --      Pain Edu? --      Excl. in [place]? --     Constitutional: Alert and oriented. Well appearing and in no acute distress. Eyes: Conjunctivae are normal. ENT      Head: Normocephalic and atraumatic. Cardiovascular:  Good peripheral circulation. Respiratory: Normal respiratory effort without tachypnea nor retractions.  Musculoskeletal: Gait not tested due to pain.  Right foot posterior tibialis and dorsalis pedis pulses equal. Except: Right lateral malleolus and right midfoot moderate tenderness direct palpation with localized edema, tenderness along ATFL ligament, pain with rotation, right foot otherwise nontender, right foot normal distal sensation and capillary refill Neurologic:  Normal speech and language.  Skin:  Skin is warm, dry and intact. No rash noted. Psychiatric: Mood and affect are normal. Speech and behavior are normal. Patient exhibits appropriate insight and judgment   ___________________________________________   LABS (all labs ordered are listed, but only abnormal results are displayed)  Labs Reviewed - No data to display ____________________________________________  RADIOLOGY  DG Ankle Complete Right  Result Date: 08/08/2019 CLINICAL DATA:  Posttraumatic right ankle pain EXAM: RIGHT ANKLE - COMPLETE 3+ VIEW COMPARISON:  None. FINDINGS: Lateral soft tissue swelling with ankle joint effusion. A small avulsion fracture is seen at the lateral talus. Normal ankle alignment. Small plantar heel spur. IMPRESSION: Soft tissue swelling overlying a tiny lateral talus avulsion fracture. Electronically Signed   By: Monte Fantasia M.D.   On: 08/08/2019 09:47   DG Foot Complete Right  Result Date: 08/08/2019 CLINICAL DATA:  Posttraumatic right ankle pain. EXAM: RIGHT FOOT COMPLETE - 3+ VIEW COMPARISON:  None. FINDINGS: Lateral soft tissue swelling at the ankle as described on dedicated  study. Mild hallux valgus with first MTP osteoarthritis. Small plantar heel spur. No acute fracture on this study. IMPRESSION: 1. Soft tissue swelling and joint effusion at the lateral ankle, see dedicated report. 2. First MTP osteoarthritis. Electronically Signed   By: Monte Fantasia M.D.   On: 08/08/2019 09:47   ____________________________________________   PROCEDURES Procedures   INITIAL IMPRESSION / ASSESSMENT AND PLAN / ED COURSE  Pertinent labs & imaging results that were available during my care of the patient were reviewed by me and considered in my medical decision making (see chart for details).  Well-appearing patient.  No acute distress.  Reinjury of right foot and ankle.  Right foot and ankle x-rays results as above per radiologist, reviewed.  Soft tissue swelling overlying a tiny lateral talus avulsion fracture.  OCL posterior and stirrup splint applied by nursing staff and crutches given.  Instructed to ice,  elevate.  Tramadol as needed for breakthrough pain.  Follow-up with podiatry this week, information given.  Tylenol ibuprofen as needed.  Ice and elevate.Discussed indication, risks and benefits of medications with patient.   Discussed follow up and return parameters including no resolution or any worsening concerns. Patient verbalized understanding and agreed to plan.    [place] controlled substance database reviewed, no recent controlled substance documented for patient.  ____________________________________________   FINAL CLINICAL IMPRESSION(S) / ED DIAGNOSES  Final diagnoses:  Closed nondisplaced avulsion fracture of right talus, initial encounter     ED Discharge Orders         Ordered    traMADol (ULTRAM) 50 MG tablet  Every 6 hours PRN     08/08/19 1005           Note: This dictation was prepared with Dragon dictation along with smaller phrase technology. Any transcriptional errors that result from this process are unintentional.          Marylene Land, NP 08/08/19 1041

## 2019-08-08 NOTE — ED Triage Notes (Signed)
Patient states she was seen in November for a right ankle sprain. She states she rolled her right ankle again last night and is requesting an xray.

## 2020-03-11 ENCOUNTER — Other Ambulatory Visit: Payer: Self-pay | Admitting: Internal Medicine

## 2020-03-11 DIAGNOSIS — Z1231 Encounter for screening mammogram for malignant neoplasm of breast: Secondary | ICD-10-CM

## 2020-04-07 NOTE — Progress Notes (Signed)
Date:  04/08/2020   Name:  Lisa Haas   DOB:  [birth year]   MRN:  [id number]   Chief Complaint: Annual Exam (breast exam / no pap/ flu shot )  Lisa Haas is a 64 y.o. female who presents today for her Complete Annual Exam. She feels well. She reports exercising none. She reports she is sleeping poorly. Breast complaints none.  Mammogram: 04/2019 scheduled for 04/28/20 Pap smear: 03/2016 neg with cotesting Colonoscopy: 10/2018  Immunization History  Administered Date(s) Administered  . Influenza, Quadrivalent, Recombinant, Inj, Pf 05/15/2018  . Influenza,inj,Quad PF,6+ Mos 04/08/2019  . Influenza-Unspecified 04/21/2016, 05/22/2017  . Moderna SARS-COVID-2 Vaccination 10/10/2019, 11/13/2019  . Zoster 07/04/2012    Hypertension Associated symptoms include shortness of breath (comes and goes ). Pertinent negatives include no chest pain, headaches or palpitations. Past treatments include calcium channel blockers. The current treatment provides significant improvement. There is no history of kidney disease, CAD/MI or CVA.  Gastroesophageal Reflux She complains of heartburn. She reports no abdominal pain, no chest pain, no coughing or no wheezing. This is a recurrent problem. The problem occurs occasionally. Pertinent negatives include no fatigue. She has tried a PPI for the symptoms. The treatment provided significant relief.  Hyperlipidemia This is a chronic problem. The problem is controlled. Associated symptoms include shortness of breath (comes and goes ). Pertinent negatives include no chest pain. Current antihyperlipidemic treatment includes statins.  Shortness of Breath This is a new problem. The problem occurs intermittently. The problem has been unchanged. Pertinent negatives include no abdominal pain, chest pain, fever, headaches, leg swelling, rash, vomiting or wheezing. The symptoms are aggravated by any activity and exercise. She has tried rest for the symptoms. The  treatment provided significant relief.    Lab Results  Component Value Date   CREATININE 0.91 04/08/2019   BUN 16 04/08/2019   NA 140 04/08/2019   K 4.1 04/08/2019   CL 101 04/08/2019   CO2 19 (L) 04/08/2019   Lab Results  Component Value Date   CHOL 246 (H) 04/08/2019   HDL 70 04/08/2019   LDLCALC 138 (H) 04/08/2019   TRIG 213 (H) 04/08/2019   CHOLHDL 3.5 04/08/2019   Lab Results  Component Value Date   TSH 1.730 04/08/2019   No results found for: HGBA1C Lab Results  Component Value Date   WBC 6.3 04/08/2019   HGB 14.3 04/08/2019   HCT 43.5 04/08/2019   MCV 89 04/08/2019   PLT 242 04/08/2019   Lab Results  Component Value Date   ALT 25 04/08/2019   AST 25 04/08/2019   ALKPHOS 89 04/08/2019   BILITOT 0.7 04/08/2019     Review of Systems  Constitutional: Negative for chills, fatigue and fever.  HENT: Negative for congestion, hearing loss, tinnitus, trouble swallowing and voice change.   Eyes: Negative for visual disturbance.  Respiratory: Positive for chest tightness and shortness of breath (comes and goes ). Negative for cough and wheezing.   Cardiovascular: Negative for chest pain, palpitations and leg swelling.  Gastrointestinal: Positive for heartburn. Negative for abdominal pain, constipation, diarrhea and vomiting.  Endocrine: Negative for polydipsia and polyuria.  Genitourinary: Negative for dysuria, frequency, genital sores, vaginal bleeding and vaginal discharge.  Musculoskeletal: Negative for arthralgias, gait problem and joint swelling.  Skin: Negative for color change and rash.  Neurological: Negative for dizziness, tremors, light-headedness and headaches.  Hematological: Negative for adenopathy. Does not bruise/bleed easily.  Psychiatric/Behavioral: Positive for sleep disturbance. Negative for dysphoric mood. The  patient is not nervous/anxious.     Patient Active Problem List   Diagnosis Date Noted  . GERD without esophagitis 04/08/2019  .  Grade II hemorrhoids 10/03/2018  . 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 Dyslipidemia 06/22/2015  . Essential (primary) hypertension 06/22/2015  . History of adenomatous polyp of colon 06/22/2015  . Hot flash, menopausal 06/22/2015  . Heart palpitations 06/22/2015  . 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 Penicillins   . Sulfa Antibiotics     Other reaction(s): Headache    Past Surgical History:  Procedure Laterality Date  . BREAST BIOPSY Left 08/22/11   benign  . BREAST BIOPSY Left 07/05/11   lt stereo/clip-neg  . COLONOSCOPY  09/19/2012   at [facility] Dr. Alain Marion    Social History   Tobacco Use  . Smoking status: Former Smoker    Quit date: 06/26/2009    Years since quitting: 10.7  . Smokeless tobacco: Never Used  Vaping Use  . Vaping Use: Never used  Substance Use Topics  . Alcohol use: Yes    Alcohol/week: 4.0 standard drinks    Types: 4 Cans of beer per week    Comment: "2-6 beers per week"  . Drug use: Yes    Types: Marijuana    Comment: "a couple of times per month"     Medication list has been reviewed and updated.  Current Meds  Medication Sig  . amLODipine (NORVASC) 10 MG tablet Take 1 tablet (10 mg total) by mouth daily.  . Aspirin-Acetaminophen-Caffeine (GOODYS EXTRA STRENGTH PO) Take 1 tablet by mouth daily.  Marland Kitchen atorvastatin (LIPITOR) 20 MG tablet Take 1 tablet (20 mg total) by mouth at bedtime.  . Multiple Vitamins-Minerals (MULTIVITAMIN WITH MINERALS) tablet Take 1 tablet by mouth daily.  Marland Kitchen omeprazole (PRILOSEC) 20 MG capsule Take 1 capsule (20 mg total) by mouth daily.  . [DISCONTINUED] traMADol (ULTRAM) 50 MG tablet Take 1 tablet (50 mg total) by mouth every 6 (six) hours as needed for moderate pain or severe pain.    PHQ 2/9 Scores 04/08/2020 04/08/2019 04/04/2018 03/27/2017  PHQ - 2 Score 0 0 0 0  PHQ- 9 Score 0 - - -    No flowsheet data found.  BP Readings from Last 3 Encounters:  04/08/20 126/86  08/08/19 127/78  06/27/19  129/82    Physical Exam Vitals and nursing note reviewed.  Constitutional:      General: She is not in acute distress.    Appearance: She is well-developed.  HENT:     Head: Normocephalic and atraumatic.     Right Ear: Tympanic membrane and ear canal normal.     Left Ear: Tympanic membrane and ear canal normal.     Nose:     Right Sinus: No maxillary sinus tenderness.     Left Sinus: No maxillary sinus tenderness.  Eyes:     General: No scleral icterus.       Right eye: No discharge.        Left eye: No discharge.     Conjunctiva/sclera: Conjunctivae normal.  Neck:     Thyroid: No thyromegaly.     Vascular: No carotid bruit.  Cardiovascular:     Rate and Rhythm: Normal rate and regular rhythm.     Pulses: Normal pulses.     Heart sounds: Normal heart sounds. No murmur heard.   Pulmonary:     Effort: Pulmonary effort is normal. No respiratory distress.     Breath sounds: No  wheezing.  Chest:     Breasts:        Right: No mass, nipple discharge, skin change or tenderness.        Left: No mass, nipple discharge, skin change or tenderness.  Abdominal:     General: Bowel sounds are normal.     Palpations: Abdomen is soft.     Tenderness: There is no abdominal tenderness.  Musculoskeletal:        General: Normal range of motion.     Cervical back: Normal range of motion. No erythema.     Right lower leg: No edema.     Left lower leg: No edema.  Lymphadenopathy:     Cervical: No cervical adenopathy.  Skin:    General: Skin is warm and dry.     Capillary Refill: Capillary refill takes less than 2 seconds.     Findings: No rash.  Neurological:     General: No focal deficit present.     Mental Status: She is alert and oriented to person, place, and time.     Cranial Nerves: No cranial nerve deficit.     Sensory: No sensory deficit.     Deep Tendon Reflexes: Reflexes are normal and symmetric.  Psychiatric:        Attention and Perception: Attention normal.        Mood  and Affect: Mood normal.        Behavior: Behavior normal.     Wt Readings from Last 3 Encounters:  04/08/20 177 lb (80.3 kg)  08/08/19 170 lb (77.1 kg)  06/27/19 170 lb (77.1 kg)    BP 126/86   Pulse 88   Temp 98 F (36.7 C) (Oral)   Ht 5\' 7"  (1.702 m)   Wt 177 lb (80.3 kg)   SpO2 97%   BMI 27.72 kg/m   Assessment and Plan: 1. Annual physical exam Normal exam Immunizations are up to date Mammogram is scheduled Colonoscopy done last year - POCT urinalysis dipstick  2. DOE (dyspnea on exertion) Will refer to cardiology She takes a Goody powder daily which contains aspirin - EKG 12-Lead - NSR; WNL - Ambulatory referral to Cardiology  3. Essential (primary) hypertension Clinically stable exam with well controlled BP on amlodipine. Tolerating medications without side effects at this time. Pt to continue current regimen and low sodium diet; benefits of regular exercise as able discussed. - CBC with Differential/Platelet - Comprehensive metabolic panel - TSH - amLODipine (NORVASC) 10 MG tablet; Take 1 tablet (10 mg total) by mouth daily.  Dispense: 90 tablet; Refill: 3  4. Dyslipidemia On high intensity statin therapy - Lipid panel - atorvastatin (LIPITOR) 20 MG tablet; Take 1 tablet (20 mg total) by mouth at bedtime.  Dispense: 90 tablet; Refill: 3  5. GERD without esophagitis Symptoms well controlled on daily PPI No red flag signs such as weight loss, n/v, melena Will continue omeprazole. - CBC with Differential/Platelet - omeprazole (PRILOSEC) 20 MG capsule; Take 1 capsule (20 mg total) by mouth daily.  Dispense: 90 capsule; Refill: 3  6. Need for immunization against influenza - Flu Vaccine QUAD 36+ mos IM   Partially dictated using Editor, commissioning. Any errors are unintentional.  Halina Maidens, MD [facility]  04/08/2020

## 2020-04-08 ENCOUNTER — Other Ambulatory Visit: Payer: Self-pay

## 2020-04-08 ENCOUNTER — Ambulatory Visit (INDEPENDENT_AMBULATORY_CARE_PROVIDER_SITE_OTHER): Payer: BC Managed Care – PPO | Admitting: Internal Medicine

## 2020-04-08 ENCOUNTER — Encounter: Payer: Self-pay | Admitting: Internal Medicine

## 2020-04-08 VITALS — BP 126/86 | HR 88 | Temp 98.0°F | Ht 67.0 in | Wt 177.0 lb

## 2020-04-08 DIAGNOSIS — I10 Essential (primary) hypertension: Secondary | ICD-10-CM

## 2020-04-08 DIAGNOSIS — Z0000 Encounter for general adult medical examination without abnormal findings: Secondary | ICD-10-CM

## 2020-04-08 DIAGNOSIS — K219 Gastro-esophageal reflux disease without esophagitis: Secondary | ICD-10-CM | POA: Diagnosis not present

## 2020-04-08 DIAGNOSIS — E785 Hyperlipidemia, unspecified: Secondary | ICD-10-CM | POA: Diagnosis not present

## 2020-04-08 DIAGNOSIS — R0609 Other forms of dyspnea: Secondary | ICD-10-CM

## 2020-04-08 DIAGNOSIS — Z23 Encounter for immunization: Secondary | ICD-10-CM | POA: Diagnosis not present

## 2020-04-08 DIAGNOSIS — R0600 Dyspnea, unspecified: Secondary | ICD-10-CM

## 2020-04-08 LAB — POCT URINALYSIS DIPSTICK
Bilirubin, UA: NEGATIVE
Blood, UA: NEGATIVE
Glucose, UA: NEGATIVE
Ketones, UA: NEGATIVE
Nitrite, UA: NEGATIVE
Protein, UA: NEGATIVE
Spec Grav, UA: 1.015 (ref 1.010–1.025)
Urobilinogen, UA: 0.2 E.U./dL
pH, UA: 6 (ref 5.0–8.0)

## 2020-04-08 MED ORDER — OMEPRAZOLE 20 MG PO CPDR: 20.0000 mg | DELAYED_RELEASE_CAPSULE | Freq: Every day | ORAL | 3 refills | Status: DC

## 2020-04-08 MED ORDER — ATORVASTATIN CALCIUM 20 MG PO TABS: 20.0000 mg | ORAL_TABLET | Freq: Every day | ORAL | 3 refills | Status: DC

## 2020-04-08 MED ORDER — AMLODIPINE BESYLATE 10 MG PO TABS: 10.0000 mg | ORAL_TABLET | Freq: Every day | ORAL | 3 refills | Status: DC

## 2020-04-09 LAB — LIPID PANEL
Chol/HDL Ratio: 3.5 ratio (ref 0.0–4.4)
Cholesterol, Total: 225 mg/dL — ABNORMAL HIGH (ref 100–199)
HDL: 65 mg/dL (ref >39)
LDL Chol Calc (NIH): 127 mg/dL — ABNORMAL HIGH (ref 0–99)
Triglycerides: 190 mg/dL — ABNORMAL HIGH (ref 0–149)
VLDL Cholesterol Cal: 33 mg/dL (ref 5–40)

## 2020-04-09 LAB — CBC WITH DIFFERENTIAL/PLATELET
Basophils Absolute: 0 10*3/uL (ref 0.0–0.2)
Basos: 1 %
EOS (ABSOLUTE): 0.1 10*3/uL (ref 0.0–0.4)
Eos: 1 %
Hematocrit: 41.7 % (ref 34.0–46.6)
Hemoglobin: 14.1 g/dL (ref 11.1–15.9)
Immature Grans (Abs): 0 10*3/uL (ref 0.0–0.1)
Immature Granulocytes: 0 %
Lymphocytes Absolute: 1.6 10*3/uL (ref 0.7–3.1)
Lymphs: 20 %
MCH: 30.1 pg (ref 26.6–33.0)
MCHC: 33.8 g/dL (ref 31.5–35.7)
MCV: 89 fL (ref 79–97)
Monocytes Absolute: 0.6 10*3/uL (ref 0.1–0.9)
Monocytes: 7 %
Neutrophils Absolute: 5.4 10*3/uL (ref 1.4–7.0)
Neutrophils: 71 %
Platelets: 243 10*3/uL (ref 150–450)
RBC: 4.69 x10E6/uL (ref 3.77–5.28)
RDW: 11.7 % (ref 11.7–15.4)
WBC: 7.8 10*3/uL (ref 3.4–10.8)

## 2020-04-09 LAB — COMPREHENSIVE METABOLIC PANEL
ALT: 25 IU/L (ref 0–32)
AST: 20 IU/L (ref 0–40)
Albumin/Globulin Ratio: 2 (ref 1.2–2.2)
Albumin: 4.7 g/dL (ref 3.8–4.8)
Alkaline Phosphatase: 94 IU/L (ref 48–121)
BUN/Creatinine Ratio: 19 (ref 12–28)
BUN: 13 mg/dL (ref 8–27)
Bilirubin Total: 0.4 mg/dL (ref 0.0–1.2)
CO2: 22 mmol/L (ref 20–29)
Calcium: 9.5 mg/dL (ref 8.7–10.3)
Chloride: 101 mmol/L (ref 96–106)
Creatinine, Ser: 0.68 mg/dL (ref 0.57–1.00)
GFR calc Af Amer: 108 mL/min/{1.73_m2} (ref >59)
GFR calc non Af Amer: 94 mL/min/{1.73_m2} (ref >59)
Globulin, Total: 2.3 g/dL (ref 1.5–4.5)
Glucose: 90 mg/dL (ref 65–99)
Potassium: 4.2 mmol/L (ref 3.5–5.2)
Sodium: 140 mmol/L (ref 134–144)
Total Protein: 7 g/dL (ref 6.0–8.5)

## 2020-04-09 LAB — TSH: TSH: 1.8 u[IU]/mL (ref 0.450–4.500)

## 2020-04-28 ENCOUNTER — Ambulatory Visit
Admission: RE | Admit: 2020-04-28 | Discharge: 2020-04-28 | Disposition: A | Discharge status: Home / Self Care | Payer: BC Managed Care – PPO | Source: Ambulatory Visit | Attending: Internal Medicine | Admitting: Internal Medicine

## 2020-04-28 ENCOUNTER — Other Ambulatory Visit: Payer: Self-pay

## 2020-04-28 DIAGNOSIS — Z1231 Encounter for screening mammogram for malignant neoplasm of breast: Secondary | ICD-10-CM | POA: Diagnosis not present

## 2020-04-29 ENCOUNTER — Other Ambulatory Visit: Payer: Self-pay | Admitting: Internal Medicine

## 2020-04-29 ENCOUNTER — Encounter: Payer: Self-pay | Admitting: Internal Medicine

## 2020-04-29 DIAGNOSIS — N6310 Unspecified lump in the right breast, unspecified quadrant: Secondary | ICD-10-CM

## 2020-04-29 DIAGNOSIS — R928 Other abnormal and inconclusive findings on diagnostic imaging of breast: Secondary | ICD-10-CM

## 2020-04-30 NOTE — Telephone Encounter (Signed)
Pls call pt back at [phone]. Please FU and call pt re this mammogram. Pt is thinking that since she has no insurance that at least at the first of the yr would go toward deductibles. As  It is this will be all out of pocket. She wanted to discuss with Dr B or her nurse. cb and if no answer pls leave a message

## 2020-05-04 ENCOUNTER — Telehealth: Payer: Self-pay | Admitting: Internal Medicine

## 2020-05-04 ENCOUNTER — Encounter: Payer: Self-pay | Admitting: Internal Medicine

## 2020-05-04 NOTE — Telephone Encounter (Signed)
Called pt she wanted to know if he could hold off further testing for her abnormal mammogram due to her not meeting her deductible for insurance. Spoke to Dr. Army Melia. Told pt that she did not need to hold off that she should still follow thru with testing. Pt then stated that she wanted to go ahead and schedule an appt. Told pt that a RF was already placed that I would look into the RF for her. Pt verbalized understanding.  KP

## 2020-05-04 NOTE — Telephone Encounter (Signed)
Please Advise.  KP

## 2020-05-04 NOTE — Telephone Encounter (Signed)
Copied from [place] [phone]. Topic: General - Call Back - No Documentation >> May 04, 2020 11:11 AM Erick Blinks wrote: Best contact: [phone], Pt would like an urgent call back to discuss her mammogram. Please advise

## 2020-05-28 ENCOUNTER — Ambulatory Visit
Admission: RE | Admit: 2020-05-28 | Discharge: 2020-05-28 | Disposition: A | Discharge status: Home / Self Care | Payer: BC Managed Care – PPO | Source: Ambulatory Visit | Attending: Internal Medicine | Admitting: Internal Medicine

## 2020-05-28 ENCOUNTER — Other Ambulatory Visit: Payer: Self-pay

## 2020-05-28 DIAGNOSIS — N6310 Unspecified lump in the right breast, unspecified quadrant: Secondary | ICD-10-CM | POA: Insufficient documentation

## 2020-05-28 DIAGNOSIS — R928 Other abnormal and inconclusive findings on diagnostic imaging of breast: Secondary | ICD-10-CM

## 2020-08-11 ENCOUNTER — Other Ambulatory Visit: Payer: BC Managed Care – PPO

## 2020-08-11 ENCOUNTER — Ambulatory Visit: Payer: BC Managed Care – PPO

## 2020-09-30 ENCOUNTER — Ambulatory Visit: Payer: BC Managed Care – PPO | Admitting: Internal Medicine

## 2021-04-15 ENCOUNTER — Encounter: Payer: Self-pay | Admitting: Internal Medicine

## 2021-04-15 ENCOUNTER — Ambulatory Visit (INDEPENDENT_AMBULATORY_CARE_PROVIDER_SITE_OTHER): Payer: BC Managed Care – PPO | Admitting: Internal Medicine

## 2021-04-15 ENCOUNTER — Other Ambulatory Visit (HOSPITAL_COMMUNITY)
Admission: RE | Admit: 2021-04-15 | Discharge: 2021-04-15 | Disposition: A | Discharge status: Home / Self Care | Payer: Self-pay | Source: Ambulatory Visit | Attending: Internal Medicine | Admitting: Internal Medicine

## 2021-04-15 ENCOUNTER — Other Ambulatory Visit: Payer: Self-pay

## 2021-04-15 VITALS — BP 130/84 | HR 87 | Temp 98.0°F | Ht 67.0 in | Wt 179.0 lb

## 2021-04-15 DIAGNOSIS — Z0000 Encounter for general adult medical examination without abnormal findings: Secondary | ICD-10-CM | POA: Diagnosis not present

## 2021-04-15 DIAGNOSIS — Z1231 Encounter for screening mammogram for malignant neoplasm of breast: Secondary | ICD-10-CM

## 2021-04-15 DIAGNOSIS — Z124 Encounter for screening for malignant neoplasm of cervix: Secondary | ICD-10-CM

## 2021-04-15 DIAGNOSIS — E785 Hyperlipidemia, unspecified: Secondary | ICD-10-CM

## 2021-04-15 DIAGNOSIS — I10 Essential (primary) hypertension: Secondary | ICD-10-CM | POA: Diagnosis not present

## 2021-04-15 DIAGNOSIS — K219 Gastro-esophageal reflux disease without esophagitis: Secondary | ICD-10-CM

## 2021-04-15 DIAGNOSIS — Z23 Encounter for immunization: Secondary | ICD-10-CM | POA: Diagnosis not present

## 2021-04-15 LAB — POCT URINALYSIS DIPSTICK
Bilirubin, UA: NEGATIVE
Blood, UA: NEGATIVE
Glucose, UA: NEGATIVE
Ketones, UA: NEGATIVE
Leukocytes, UA: NEGATIVE
Nitrite, UA: NEGATIVE
Protein, UA: NEGATIVE
Spec Grav, UA: 1.02 (ref 1.010–1.025)
Urobilinogen, UA: 0.2 E.U./dL
pH, UA: 6 (ref 5.0–8.0)

## 2021-04-15 MED ORDER — OMEPRAZOLE 20 MG PO CPDR: 20.0000 mg | DELAYED_RELEASE_CAPSULE | Freq: Every day | ORAL | 3 refills | Status: DC

## 2021-04-15 MED ORDER — ATORVASTATIN CALCIUM 40 MG PO TABS: 40.0000 mg | ORAL_TABLET | Freq: Every day | ORAL | 3 refills | Status: DC

## 2021-04-15 MED ORDER — AMLODIPINE BESYLATE 10 MG PO TABS: 10.0000 mg | ORAL_TABLET | Freq: Every day | ORAL | 3 refills | Status: DC

## 2021-04-15 NOTE — Progress Notes (Signed)
Date:  04/15/2021   Name:  Lisa Haas   DOB:  Aug 19, 1955   MRN:  [id number]   Chief Complaint: Annual Exam (Breast exam with pap) and Flu Vaccine Lisa Haas is a 65 y.o. female who presents today for her Complete Annual Exam. She feels well. She reports exercising none. She reports she is sleeping well. Breast complaints none.  She has sold her house and is going to rent for a few years before ultimately retiring at the [place]: 04/2020 DEXA: none Pap smear: 03/2016 neg with cotesting Colonoscopy: 10/2018 repeat 2030  Immunization History  Administered Date(s) Administered   Influenza, Quadrivalent, Recombinant, Inj, Pf 05/15/2018   Influenza,inj,Quad PF,6+ Mos 04/08/2019, 04/08/2020   Influenza-Unspecified 04/21/2016, 05/22/2017   Moderna Sars-Covid-2 Vaccination 10/10/2019, 11/13/2019   Zoster, Live 07/04/2012    Hypertension This is a chronic problem. The problem is controlled. Pertinent negatives include no chest pain, headaches, palpitations or shortness of breath. Past treatments include calcium channel blockers. The current treatment provides significant improvement.  Hyperlipidemia This is a chronic problem. The problem is controlled. Pertinent negatives include no chest pain or shortness of breath. Current antihyperlipidemic treatment includes statins. The current treatment provides significant improvement of lipids.   Lab Results  Component Value Date   CREATININE 0.68 04/08/2020   BUN 13 04/08/2020   NA 140 04/08/2020   K 4.2 04/08/2020   CL 101 04/08/2020   CO2 22 04/08/2020   Lab Results  Component Value Date   CHOL 225 (H) 04/08/2020   HDL 65 04/08/2020   LDLCALC 127 (H) 04/08/2020   TRIG 190 (H) 04/08/2020   CHOLHDL 3.5 04/08/2020   Lab Results  Component Value Date   TSH 1.800 04/08/2020   No results found for: HGBA1C Lab Results  Component Value Date   WBC 7.8 04/08/2020   HGB 14.1 04/08/2020   HCT 41.7 04/08/2020   MCV 89  04/08/2020   PLT 243 04/08/2020   Lab Results  Component Value Date   ALT 25 04/08/2020   AST 20 04/08/2020   ALKPHOS 94 04/08/2020   BILITOT 0.4 04/08/2020     Review of Systems  Constitutional:  Negative for chills, fatigue and fever.  HENT:  Negative for congestion, hearing loss, tinnitus, trouble swallowing and voice change.   Eyes:  Negative for visual disturbance.  Respiratory:  Negative for cough, chest tightness, shortness of breath and wheezing.   Cardiovascular:  Negative for chest pain, palpitations and leg swelling.  Gastrointestinal:  Negative for abdominal pain, constipation, diarrhea and vomiting.  Endocrine: Negative for polydipsia and polyuria.  Genitourinary:  Negative for dysuria, frequency, genital sores, vaginal bleeding and vaginal discharge.  Musculoskeletal:  Negative for arthralgias, gait problem and joint swelling.  Skin:  Negative for color change and rash.  Neurological:  Negative for dizziness, tremors, light-headedness and headaches.  Hematological:  Negative for adenopathy. Does not bruise/bleed easily.  Psychiatric/Behavioral:  Negative for dysphoric mood and sleep disturbance. The patient is not nervous/anxious.    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 Alain Marion    Social History   Tobacco Use   Smoking status: Former    Types:  Cigarettes    Quit date: 06/26/2009    Years since quitting: 11.8   Smokeless tobacco: Never  Vaping Use   Vaping Use: Never used  Substance Use Topics   Alcohol use: Yes    Alcohol/week: 4.0 standard drinks    Types: 4 Cans of beer per week    Comment: "2-6 beers per week"   Drug use: Yes    Types: Marijuana    Comment: "a couple of times per month"     Medication list has been reviewed and updated.  Current Meds  Medication Sig   amLODipine (NORVASC) 10 MG tablet Take 1 tablet (10 mg total) by mouth daily.   Aspirin-Acetaminophen-Caffeine (GOODYS EXTRA STRENGTH PO) Take 1 tablet by mouth daily.   atorvastatin (LIPITOR) 40 MG tablet Take by mouth.   Multiple Vitamins-Minerals (MULTIVITAMIN WITH MINERALS) tablet Take 1 tablet by mouth daily.   omeprazole (PRILOSEC) 20 MG capsule Take 1 capsule (20 mg total) by mouth daily.    PHQ 2/9 Scores 04/15/2021 04/08/2020 04/08/2019 04/04/2018  PHQ - 2 Score 0 0 0 0  PHQ- 9 Score 2 0 - -    GAD 7 : Generalized Anxiety Score 04/15/2021  Nervous, Anxious, on Edge 0  Control/stop worrying 0  Worry too much - different things 0  Trouble relaxing 0  Restless 0  Easily annoyed or irritable 0  Afraid - awful might happen 0  Total GAD 7 Score 0    BP Readings from Last 3 Encounters:  04/15/21 130/84  04/08/20 126/86  08/08/19 127/78    Physical Exam Vitals and nursing note reviewed.  Constitutional:      General: She is not in acute distress.    Appearance: She is well-developed.  HENT:     Head: Normocephalic and atraumatic.     Right Ear: Tympanic membrane and ear canal normal.     Left Ear: Tympanic membrane and ear canal normal.     Nose:     Right Sinus: No maxillary sinus tenderness.     Left Sinus: No maxillary sinus tenderness.  Eyes:     General: No scleral icterus.       Right eye: No discharge.        Left eye: No discharge.     Conjunctiva/sclera: Conjunctivae normal.  Neck:     Thyroid: No thyromegaly.      Vascular: No carotid bruit.  Cardiovascular:     Rate and Rhythm: Normal rate and regular rhythm.     Pulses: Normal pulses.     Heart sounds: Normal heart sounds.  Pulmonary:     Effort: Pulmonary effort is normal. No respiratory distress.     Breath sounds: No wheezing.  Chest:  Breasts:    Right: No mass, nipple discharge, skin change or tenderness.     Left: No mass, nipple discharge, skin change or tenderness.  Abdominal:     General: Bowel sounds are normal.     Palpations: Abdomen is soft.     Tenderness: There is no abdominal tenderness.  Genitourinary:    Labia:        Right: No tenderness,  lesion or injury.        Left: No tenderness, lesion or injury.      Urethra: No prolapse.     Vagina: Normal.     Cervix: Normal.     Uterus: Normal.      Adnexa: Right adnexa normal and left adnexa normal.     Comments: Mild rectocele Musculoskeletal:     Cervical back: Normal range of motion. No erythema.     Right lower leg: No edema.     Left lower leg: No edema.  Lymphadenopathy:     Cervical: No cervical adenopathy.  Skin:    General: Skin is warm and dry.     Capillary Refill: Capillary refill takes less than 2 seconds.     Findings: No rash.  Neurological:     Mental Status: She is alert and oriented to person, place, and time.     Cranial Nerves: No cranial nerve deficit.     Sensory: No sensory deficit.     Deep Tendon Reflexes: Reflexes are normal and symmetric.  Psychiatric:        Attention and Perception: Attention normal.        Mood and Affect: Mood normal.    Wt Readings from Last 3 Encounters:  04/15/21 179 lb (81.2 kg)  04/08/20 177 lb (80.3 kg)  08/08/19 170 lb (77.1 kg)    BP 130/84   Pulse 87   Temp 98 F (36.7 C) (Oral)   Ht '5\' 7"'$  (1.702 m)   Wt 179 lb (81.2 kg)   SpO2 98%   BMI 28.04 kg/m   Assessment and Plan: 1. Annual physical exam Normal exam. Continue healthy diet, regular exercise Stress test last year was normal. -  TSH  2. Encounter for screening mammogram for breast cancer - MM 3D SCREEN BREAST BILATERAL  3. Encounter for screening for cervical cancer Obtained today - Cytology - PAP  4. Essential (primary) hypertension Clinically stable exam with well controlled BP. Tolerating medications without side effects at this time. Pt to continue current regimen and low sodium diet; benefits of regular exercise as able discussed. - CBC with Differential/Platelet - Comprehensive metabolic panel - POCT urinalysis dipstick - amLODipine (NORVASC) 10 MG tablet; Take 1 tablet (10 mg total) by mouth daily.  Dispense: 90 tablet; Refill: 3  5. Dyslipidemia Controlled. - Lipid panel - atorvastatin (LIPITOR) 40 MG tablet; Take 1 tablet (40 mg total) by mouth daily.  Dispense: 90 tablet; Refill: 3  6. GERD without esophagitis Symptoms well controlled on daily PPI No red flag signs such as weight loss, n/v, melena Will continue omeprazole. - omeprazole (PRILOSEC) 20 MG capsule; Take 1 capsule (20 mg total) by mouth daily.  Dispense: 90 capsule; Refill: 3   Partially dictated using Editor, commissioning. Any errors are unintentional.  Halina Maidens, MD [facility]  04/15/2021

## 2021-04-16 LAB — LIPID PANEL
Chol/HDL Ratio: 3.2 ratio (ref 0.0–4.4)
Cholesterol, Total: 219 mg/dL — ABNORMAL HIGH (ref 100–199)
HDL: 68 mg/dL (ref >39)
LDL Chol Calc (NIH): 127 mg/dL — ABNORMAL HIGH (ref 0–99)
Triglycerides: 137 mg/dL (ref 0–149)
VLDL Cholesterol Cal: 24 mg/dL (ref 5–40)

## 2021-04-16 LAB — CBC WITH DIFFERENTIAL/PLATELET
Basophils Absolute: 0.1 10*3/uL (ref 0.0–0.2)
Basos: 1 %
EOS (ABSOLUTE): 0.1 10*3/uL (ref 0.0–0.4)
Eos: 2 %
Hematocrit: 43.5 % (ref 34.0–46.6)
Hemoglobin: 14.1 g/dL (ref 11.1–15.9)
Immature Grans (Abs): 0 10*3/uL (ref 0.0–0.1)
Immature Granulocytes: 0 %
Lymphocytes Absolute: 2 10*3/uL (ref 0.7–3.1)
Lymphs: 29 %
MCH: 29.1 pg (ref 26.6–33.0)
MCHC: 32.4 g/dL (ref 31.5–35.7)
MCV: 90 fL (ref 79–97)
Monocytes Absolute: 0.5 10*3/uL (ref 0.1–0.9)
Monocytes: 8 %
Neutrophils Absolute: 4.2 10*3/uL (ref 1.4–7.0)
Neutrophils: 60 %
Platelets: 250 10*3/uL (ref 150–450)
RBC: 4.84 x10E6/uL (ref 3.77–5.28)
RDW: 12.5 % (ref 11.7–15.4)
WBC: 6.9 10*3/uL (ref 3.4–10.8)

## 2021-04-16 LAB — COMPREHENSIVE METABOLIC PANEL
ALT: 28 IU/L (ref 0–32)
AST: 23 IU/L (ref 0–40)
Albumin/Globulin Ratio: 2.2 (ref 1.2–2.2)
Albumin: 4.8 g/dL (ref 3.8–4.8)
Alkaline Phosphatase: 88 IU/L (ref 44–121)
BUN/Creatinine Ratio: 17 (ref 12–28)
BUN: 12 mg/dL (ref 8–27)
Bilirubin Total: 0.4 mg/dL (ref 0.0–1.2)
CO2: 21 mmol/L (ref 20–29)
Calcium: 9.6 mg/dL (ref 8.7–10.3)
Chloride: 104 mmol/L (ref 96–106)
Creatinine, Ser: 0.69 mg/dL (ref 0.57–1.00)
Globulin, Total: 2.2 g/dL (ref 1.5–4.5)
Glucose: 107 mg/dL — ABNORMAL HIGH (ref 65–99)
Potassium: 4.6 mmol/L (ref 3.5–5.2)
Sodium: 141 mmol/L (ref 134–144)
Total Protein: 7 g/dL (ref 6.0–8.5)
eGFR: 97 mL/min/{1.73_m2} (ref >59)

## 2021-04-16 LAB — TSH: TSH: 1.79 u[IU]/mL (ref 0.450–4.500)

## 2021-04-16 NOTE — Progress Notes (Signed)
A1C added R73.09.  KP 

## 2021-04-20 LAB — HGB A1C W/O EAG: Hgb A1c MFr Bld: 5.7 % — ABNORMAL HIGH (ref 4.8–5.6)

## 2021-04-20 LAB — SPECIMEN STATUS REPORT

## 2021-04-21 LAB — CYTOLOGY - PAP
Comment: NEGATIVE
Diagnosis: NEGATIVE
High risk HPV: NEGATIVE

## 2021-06-10 ENCOUNTER — Ambulatory Visit
Admission: RE | Admit: 2021-06-10 | Discharge: 2021-06-10 | Disposition: A | Discharge status: Home / Self Care | Payer: BC Managed Care – PPO | Source: Ambulatory Visit | Attending: Internal Medicine | Admitting: Internal Medicine

## 2021-06-10 ENCOUNTER — Other Ambulatory Visit: Payer: Self-pay

## 2021-06-10 DIAGNOSIS — Z1231 Encounter for screening mammogram for malignant neoplasm of breast: Secondary | ICD-10-CM | POA: Diagnosis present

## 2021-09-28 ENCOUNTER — Telehealth: Payer: BC Managed Care – PPO | Admitting: Physician Assistant

## 2021-09-28 DIAGNOSIS — B9689 Other specified bacterial agents as the cause of diseases classified elsewhere: Secondary | ICD-10-CM

## 2021-09-28 DIAGNOSIS — J019 Acute sinusitis, unspecified: Secondary | ICD-10-CM

## 2021-09-28 MED ORDER — DOXYCYCLINE HYCLATE 100 MG PO TABS: 100.0000 mg | ORAL_TABLET | Freq: Two times a day (BID) | ORAL | 0 refills | Status: DC

## 2021-09-28 NOTE — Progress Notes (Signed)

## 2021-10-13 ENCOUNTER — Ambulatory Visit: Payer: BC Managed Care – PPO | Admitting: Internal Medicine

## 2021-12-25 IMAGING — MG DIGITAL SCREENING BILAT W/ TOMO W/ CAD
8 series · 8 of 24 positions shown · non-contrast
Comparison: Previous exam(s).

CLINICAL DATA: Screening.

EXAM:
DIGITAL SCREENING BILATERAL MAMMOGRAM WITH TOMO AND CAD

[R MLO synth-2D]
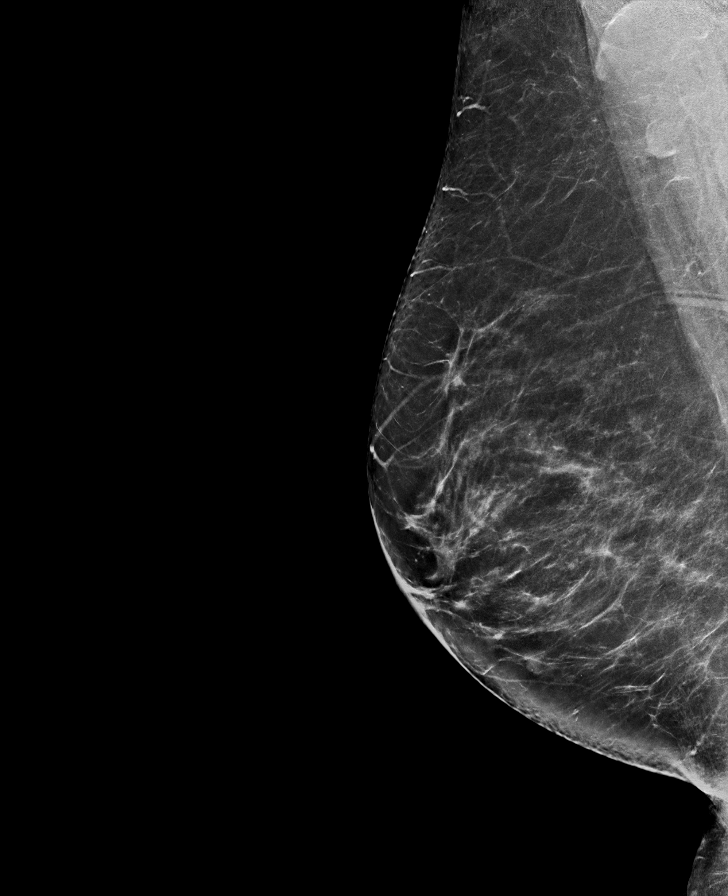

[R CC synth-2D]
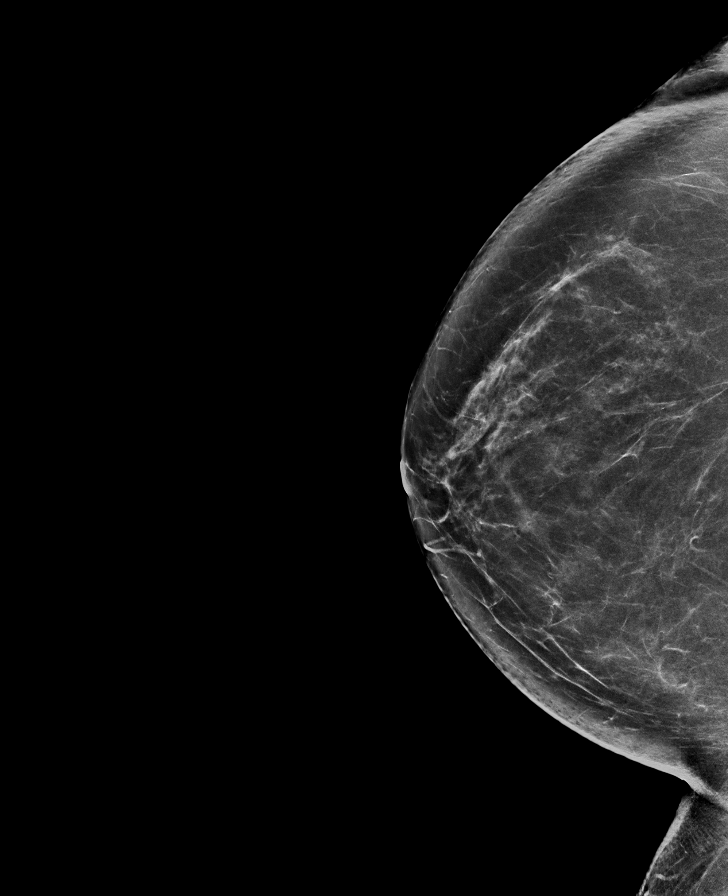

[L MLO synth-2D]
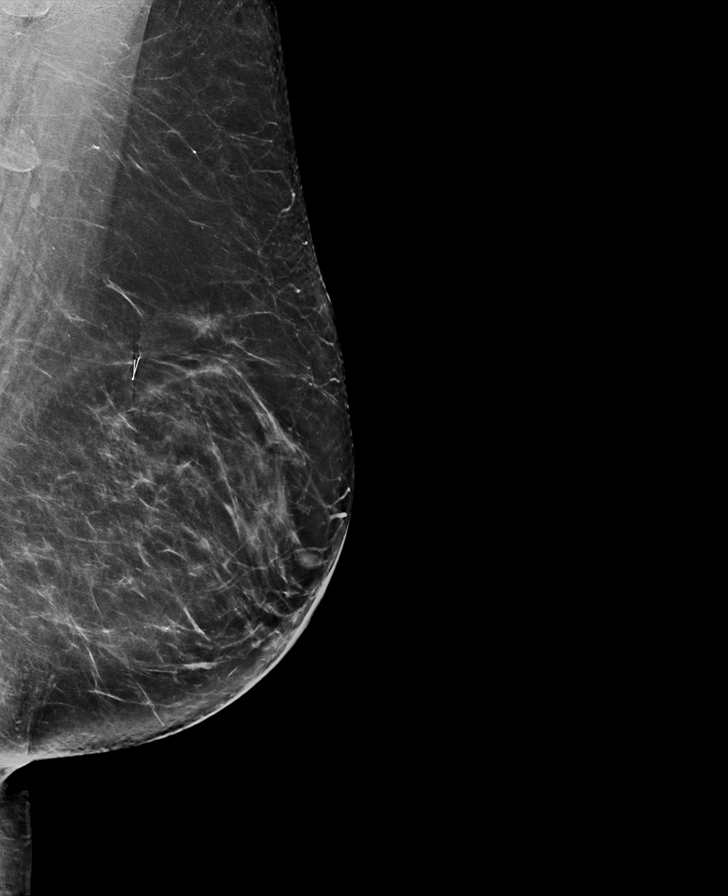

[L CC synth-2D]
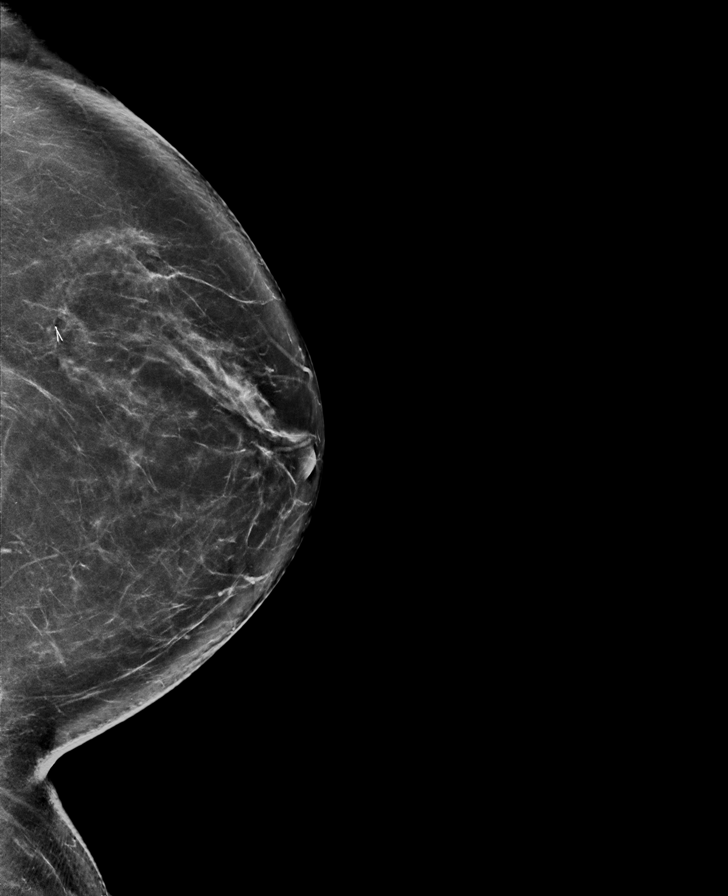

[L CC tomo · tomo slice 43/84.0]
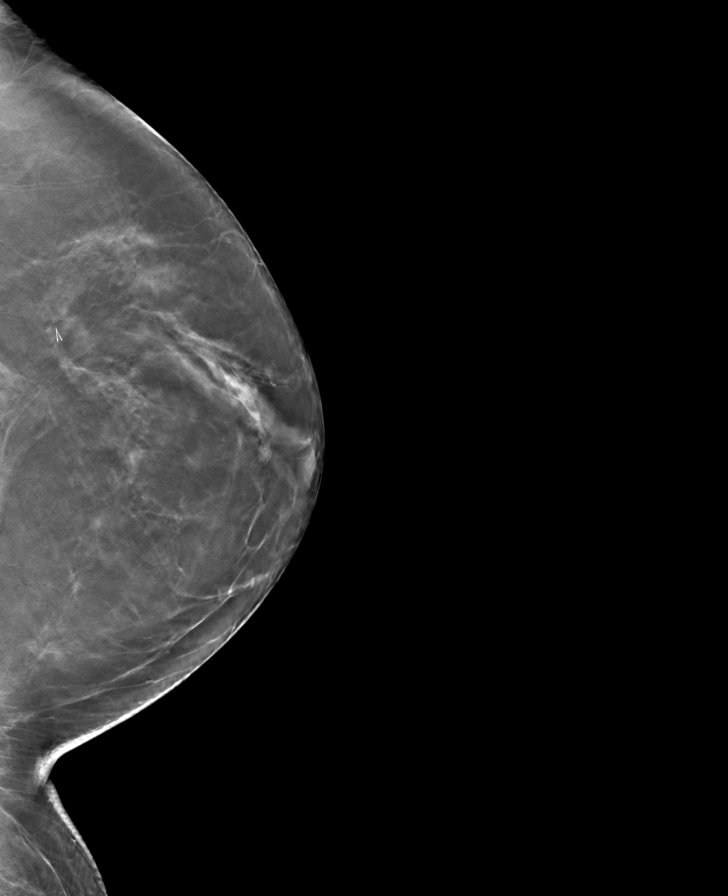

[R MLO tomo · tomo slice 43/85.0]
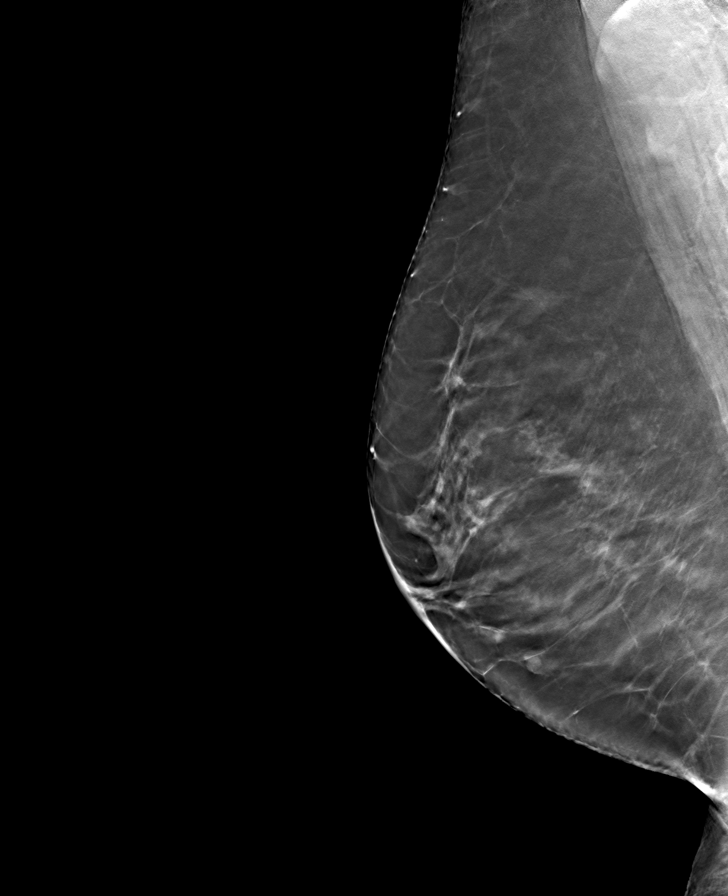

[L MLO tomo · tomo slice 43/84.0]
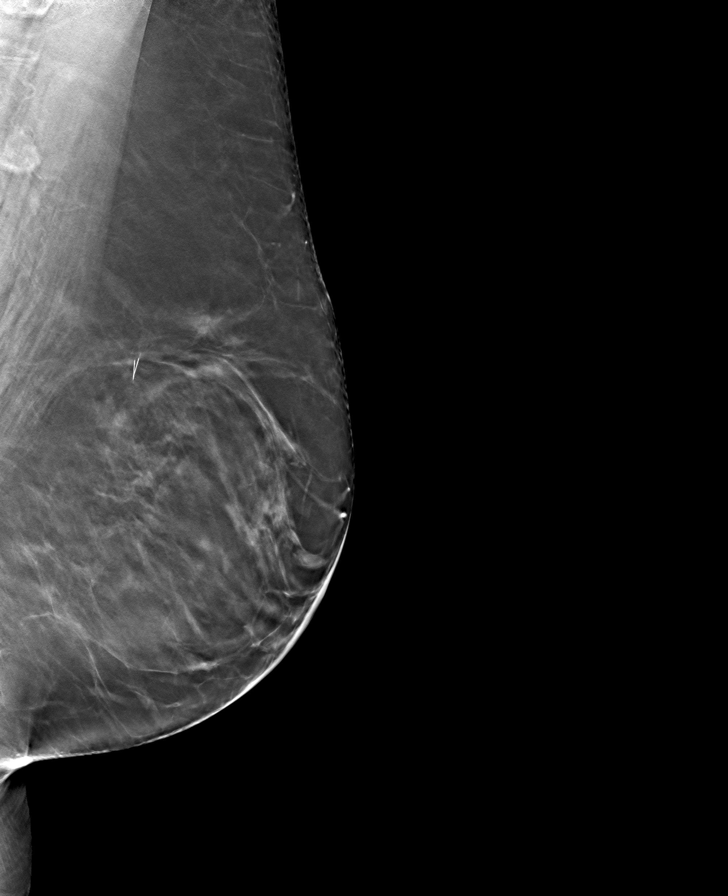

[R CC tomo · tomo slice 43/86.0]
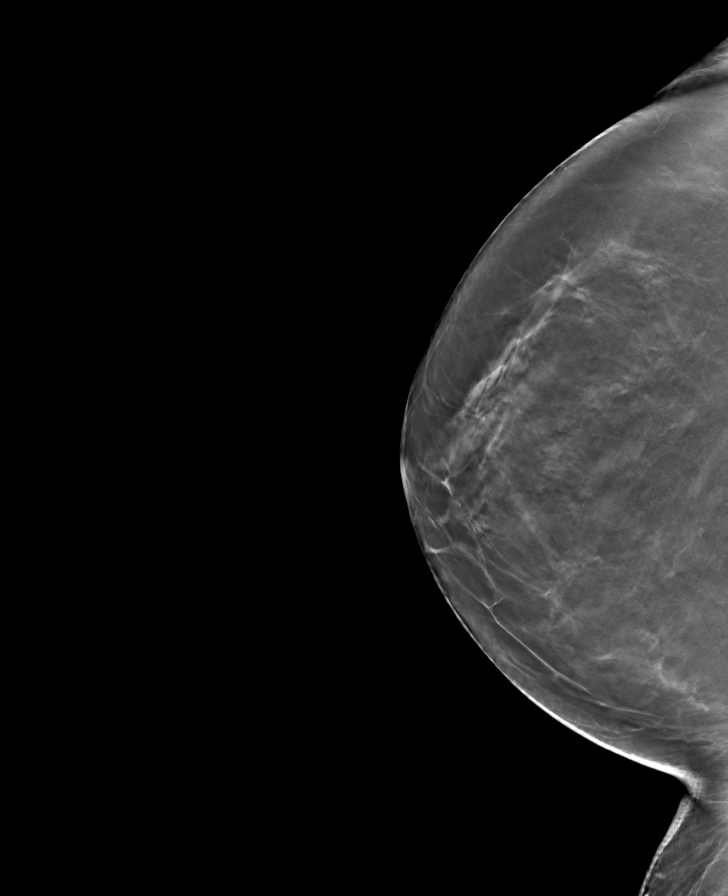

[8 of 24 positions shown; findings below may reference images not displayed]

ACR Breast Density Category b: There are scattered areas of
fibroglandular density.
FINDINGS: In the right breast, a possible mass warrants further evaluation. In
the left breast, no findings suspicious for malignancy. Images were
processed with CAD.
IMPRESSION: Further evaluation is suggested for a possible mass in the right
breast.

RECOMMENDATION:
Diagnostic mammogram and possibly ultrasound of the right breast.
(Code:E5-X-CC4)

The patient will be contacted regarding the findings, and additional
imaging will be scheduled.

BI-RADS CATEGORY  0: Incomplete. Need additional imaging evaluation
and/or prior mammograms for comparison.

## 2022-04-09 ENCOUNTER — Other Ambulatory Visit: Payer: Self-pay | Admitting: Internal Medicine

## 2022-04-09 DIAGNOSIS — E785 Hyperlipidemia, unspecified: Secondary | ICD-10-CM

## 2022-04-12 NOTE — Telephone Encounter (Signed)
Requested Prescriptions  Pending Prescriptions Disp Refills  . atorvastatin (LIPITOR) 40 MG tablet [Pharmacy Med Name: Atorvastatin Calcium 40 MG Oral Tablet] 30 tablet 0    Sig: Take 1 tablet by mouth once daily     Cardiovascular:  Antilipid - Statins Failed - 04/09/2022 10:03 AM      Failed - Lipid Panel in normal range within the last 12 months    Cholesterol, Total  Date Value Ref Range Status  04/15/2021 219 (H) 100 - 199 mg/dL Final   LDL Chol Calc (NIH)  Date Value Ref Range Status  04/15/2021 127 (H) 0 - 99 mg/dL Final   HDL  Date Value Ref Range Status  04/15/2021 68 >39 mg/dL Final   Triglycerides  Date Value Ref Range Status  04/15/2021 137 0 - 149 mg/dL Final         Passed - Patient is not pregnant      Passed - Valid encounter within last 12 months    Recent Outpatient Visits          12 months ago Annual physical exam   [place] [facility] at [facility], Jesse Sans, MD   2 years ago Annual physical exam   [place] [facility] at [facility], Jesse Sans, MD   3 years ago Annual physical exam   [place] [facility] at [facility], Jesse Sans, MD   4 years ago Annual physical exam   [facility] at [facility], Jesse Sans, MD   4 years ago Essential (primary) hypertension   [place] [facility] at [facility], Jesse Sans, MD      Future Appointments            In 1 week Army Melia, Jesse Sans, MD [place] [facility] at [facility], [facility]

## 2022-04-19 ENCOUNTER — Encounter: Payer: Self-pay | Admitting: Internal Medicine

## 2022-04-19 ENCOUNTER — Ambulatory Visit (INDEPENDENT_AMBULATORY_CARE_PROVIDER_SITE_OTHER): Payer: BC Managed Care – PPO | Admitting: Internal Medicine

## 2022-04-19 VITALS — BP 134/86 | HR 86 | Ht 67.0 in | Wt 185.0 lb

## 2022-04-19 DIAGNOSIS — Z131 Encounter for screening for diabetes mellitus: Secondary | ICD-10-CM

## 2022-04-19 DIAGNOSIS — Z0000 Encounter for general adult medical examination without abnormal findings: Secondary | ICD-10-CM

## 2022-04-19 DIAGNOSIS — Z1231 Encounter for screening mammogram for malignant neoplasm of breast: Secondary | ICD-10-CM | POA: Diagnosis not present

## 2022-04-19 DIAGNOSIS — E785 Hyperlipidemia, unspecified: Secondary | ICD-10-CM

## 2022-04-19 DIAGNOSIS — Z23 Encounter for immunization: Secondary | ICD-10-CM

## 2022-04-19 DIAGNOSIS — I10 Essential (primary) hypertension: Secondary | ICD-10-CM

## 2022-04-19 DIAGNOSIS — K219 Gastro-esophageal reflux disease without esophagitis: Secondary | ICD-10-CM

## 2022-04-19 MED ORDER — OMEPRAZOLE 20 MG PO CPDR: 20.0000 mg | DELAYED_RELEASE_CAPSULE | Freq: Every day | ORAL | 3 refills | Status: DC

## 2022-04-19 MED ORDER — AMLODIPINE BESYLATE 10 MG PO TABS: 10.0000 mg | ORAL_TABLET | Freq: Every day | ORAL | 3 refills | Status: DC

## 2022-04-19 MED ORDER — ATORVASTATIN CALCIUM 40 MG PO TABS: 40.0000 mg | ORAL_TABLET | Freq: Every day | ORAL | 3 refills | Status: DC

## 2022-04-19 NOTE — Progress Notes (Signed)
Date:  04/19/2022   Name:  Lisa Haas   DOB:  06-28-1956   MRN:  [id number]   Chief Complaint: Annual Exam (Breat exam no pap ) and Flu Vaccine Lisa Haas is a 66 y.o. female who presents today for her Complete Annual Exam. She feels well. She reports exercising walking 2-3 times a week. She reports she is sleeping fairly well. Breast complaints none.  Mammogram: 06/2021 DEXA: none Pap smear: 04/2021 neg/neg Colonoscopy: 10/2018 repeat 10 yrs  Health Maintenance Due  Topic Date Due   TETANUS/TDAP  Never done   Zoster Vaccines- Shingrix (1 of 2) Never done   INFLUENZA VACCINE  03/08/2022    Immunization History  Administered Date(s) Administered   Influenza, Quadrivalent, Recombinant, Inj, Pf 05/15/2018   Influenza,inj,Quad PF,6+ Mos 04/08/2019, 04/08/2020, 04/15/2021   Influenza-Unspecified 04/21/2016, 05/22/2017   Moderna Sars-Covid-2 Vaccination 10/10/2019, 11/13/2019   Zoster, Live 07/04/2012    Hypertension This is a chronic problem. The problem is controlled. Pertinent negatives include no chest pain, headaches, palpitations or shortness of breath. Past treatments include calcium channel blockers. The current treatment provides significant improvement. There are no compliance problems.  There is no history of kidney disease, CAD/MI or CVA.  Gastroesophageal Reflux She complains of heartburn. She reports no abdominal pain, no chest pain, no coughing or no wheezing. This is a recurrent problem. The problem occurs occasionally. Pertinent negatives include no fatigue. She has tried a PPI for the symptoms.  Hyperlipidemia This is a chronic problem. The problem is controlled. Pertinent negatives include no chest pain or shortness of breath. Current antihyperlipidemic treatment includes statins. The current treatment provides moderate improvement of lipids.    Lab Results  Component Value Date   NA 141 04/15/2021   K 4.6 04/15/2021   CO2 21 04/15/2021   GLUCOSE 107  (H) 04/15/2021   BUN 12 04/15/2021   CREATININE 0.69 04/15/2021   CALCIUM 9.6 04/15/2021   EGFR 97 04/15/2021   GFRNONAA 94 04/08/2020   Lab Results  Component Value Date   CHOL 219 (H) 04/15/2021   HDL 68 04/15/2021   LDLCALC 127 (H) 04/15/2021   TRIG 137 04/15/2021   CHOLHDL 3.2 04/15/2021   Lab Results  Component Value Date   TSH 1.790 04/15/2021   Lab Results  Component Value Date   HGBA1C 5.7 (H) 04/15/2021   Lab Results  Component Value Date   WBC 6.9 04/15/2021   HGB 14.1 04/15/2021   HCT 43.5 04/15/2021   MCV 90 04/15/2021   PLT 250 04/15/2021   Lab Results  Component Value Date   ALT 28 04/15/2021   AST 23 04/15/2021   ALKPHOS 88 04/15/2021   BILITOT 0.4 04/15/2021   No results found for: "25OHVITD2", "25OHVITD3", "VD25OH"   Review of Systems  Constitutional:  Negative for chills, fatigue and fever.  HENT:  Negative for congestion, hearing loss, tinnitus, trouble swallowing and voice change.   Eyes:  Negative for visual disturbance.  Respiratory:  Negative for cough, chest tightness, shortness of breath and wheezing.   Cardiovascular:  Negative for chest pain, palpitations and leg swelling.  Gastrointestinal:  Positive for heartburn. Negative for abdominal pain, constipation, diarrhea and vomiting.  Endocrine: Negative for polydipsia and polyuria.  Genitourinary:  Negative for dysuria, frequency, genital sores, vaginal bleeding and vaginal discharge.  Musculoskeletal:  Negative for arthralgias, gait problem and joint swelling.  Skin:  Negative for color change and rash.  Neurological:  Negative for dizziness, tremors, light-headedness  and headaches.  Hematological:  Negative for adenopathy. Does not bruise/bleed easily.  Psychiatric/Behavioral:  Negative for dysphoric mood and sleep disturbance. The patient is not nervous/anxious.     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 Alain Marion    Social History   Tobacco Use   Smoking status: Former    Types: Cigarettes    Quit date: 06/26/2009    Years since quitting: 12.8   Smokeless tobacco: Never  Vaping Use   Vaping Use: Never used  Substance Use Topics   Alcohol use: Yes    Alcohol/week: 4.0 standard drinks of alcohol    Types: 4 Cans of beer per week    Comment: "2-6 beers per week"   Drug use: Yes    Types: Marijuana    Comment: "a couple of times per month"     Medication list has been reviewed and updated.  Current Meds  Medication Sig   amLODipine (NORVASC) 10 MG tablet Take 1 tablet (10 mg total) by mouth daily.   Aspirin-Acetaminophen-Caffeine (GOODYS EXTRA STRENGTH PO) Take 1 tablet by mouth daily.   atorvastatin (LIPITOR) 40 MG tablet Take 1 tablet by mouth once daily   Multiple Vitamins-Minerals (MULTIVITAMIN WITH MINERALS) tablet Take 1 tablet by mouth daily.   Omega-3 Fatty Acids (FISH OIL) 500 MG CAPS Take by mouth.   omeprazole (PRILOSEC) 20 MG capsule Take 1 capsule (20 mg total) by mouth daily.   [DISCONTINUED] doxycycline (VIBRA-TABS) 100 MG tablet Take 1 tablet (100 mg total) by mouth 2 (two) times daily.       04/15/2021    8:28 AM  GAD 7 : Generalized Anxiety Score  Nervous, Anxious, on Edge 0  Control/stop worrying 0  Worry too much - different things 0  Trouble relaxing 0  Restless 0  Easily annoyed or irritable 0  Afraid -  awful might happen 0  Total GAD 7 Score 0       04/15/2021    8:27 AM 04/08/2020    8:01 AM 04/08/2019    8:22 AM  Depression screen PHQ 2/9  Decreased Interest 0 0 0  Down, Depressed, Hopeless 0 0 0  PHQ - 2 Score 0 0 0  Altered sleeping 2 0   Tired, decreased energy 0 0   Change in appetite 0 0   Feeling bad or failure about yourself  0 0   Trouble concentrating 0 0   Moving slowly or fidgety/restless 0 0   Suicidal thoughts 0 0   PHQ-9 Score 2 0   Difficult doing work/chores Not difficult at all Not difficult at all     BP Readings from Last 3 Encounters:  04/19/22 134/86  04/15/21 130/84  04/08/20 126/86    Physical Exam Vitals and nursing note reviewed.  Constitutional:      General: She  is not in acute distress.    Appearance: She is well-developed.  HENT:     Head: Normocephalic and atraumatic.     Right Ear: Tympanic membrane and ear canal normal.     Left Ear: Tympanic membrane and ear canal normal.     Nose:     Right Sinus: No maxillary sinus tenderness.     Left Sinus: No maxillary sinus tenderness.  Eyes:     General: No scleral icterus.       Right eye: No discharge.        Left eye: No discharge.     Conjunctiva/sclera: Conjunctivae normal.  Neck:     Thyroid: No thyromegaly.     Vascular: No carotid bruit.  Cardiovascular:     Rate and Rhythm: Normal rate and regular rhythm.     Pulses: Normal pulses.     Heart sounds: Normal heart sounds.  Pulmonary:     Effort: Pulmonary effort is normal. No respiratory distress.     Breath sounds: No wheezing.  Chest:  Breasts:    Right: No mass, nipple discharge, skin change or tenderness.     Left: No mass, nipple discharge, skin change or tenderness.  Abdominal:     General: Bowel sounds are normal.     Palpations: Abdomen is soft.     Tenderness: There is no abdominal tenderness.  Musculoskeletal:     Cervical back: Normal range of motion. No erythema.     Right lower leg: No edema.     Left lower  leg: No edema.  Lymphadenopathy:     Cervical: No cervical adenopathy.  Skin:    General: Skin is warm and dry.     Findings: No rash.  Neurological:     Mental Status: She is alert and oriented to person, place, and time.     Cranial Nerves: No cranial nerve deficit.     Sensory: No sensory deficit.     Deep Tendon Reflexes: Reflexes are normal and symmetric.  Psychiatric:        Attention and Perception: Attention normal.        Mood and Affect: Mood normal.     Wt Readings from Last 3 Encounters:  04/19/22 185 lb (83.9 kg)  04/15/21 179 lb (81.2 kg)  04/08/20 177 lb (80.3 kg)    BP 134/86   Pulse 86   Ht '5\' 7"'  (1.702 m)   Wt 185 lb (83.9 kg)   SpO2 99%   BMI 28.98 kg/m   Assessment and Plan: 1. Annual physical exam Normal exam; continue healthy diet and exercise Try cutting back on nightly beer for weight loss - CBC with Differential/Platelet - Comprehensive metabolic panel - Hemoglobin A1c - Lipid panel - TSH  2. Encounter for screening mammogram for breast cancer Schedule at [facility] - MM 3D SCREEN BREAST BILATERAL  3. Screening for diabetes mellitus - Hemoglobin A1c  4. Essential (primary) hypertension Clinically stable exam with well controlled BP. Tolerating medications without side effects at this time. Pt to continue current regimen and low sodium diet; increase exercise - CBC with Differential/Platelet - Comprehensive metabolic panel - TSH - Urinalysis, Routine w reflex microscopic - amLODipine (NORVASC) 10 MG tablet; Take 1 tablet (10 mg total) by mouth daily.  Dispense: 90 tablet; Refill: 3  5. GERD without esophagitis Symptoms well controlled on daily PPI No red flag signs such as weight loss, n/v, melena - CBC with Differential/Platelet - omeprazole (PRILOSEC) 20 MG capsule; Take 1  capsule (20 mg total) by mouth daily.  Dispense: 90 capsule; Refill: 3  6. Dyslipidemia Continue statin therapy - Lipid panel - atorvastatin (LIPITOR) 40 MG  tablet; Take 1 tablet (40 mg total) by mouth daily.  Dispense: 90 tablet; Refill: 3   Partially dictated using Editor, commissioning. Any errors are unintentional.  Halina Maidens, MD [facility]  04/19/2022

## 2022-04-20 LAB — CBC WITH DIFFERENTIAL/PLATELET
Basophils Absolute: 0.1 10*3/uL (ref 0.0–0.2)
Basos: 1 %
EOS (ABSOLUTE): 0.2 10*3/uL (ref 0.0–0.4)
Eos: 3 %
Hematocrit: 40.9 % (ref 34.0–46.6)
Hemoglobin: 13.6 g/dL (ref 11.1–15.9)
Immature Grans (Abs): 0 10*3/uL (ref 0.0–0.1)
Immature Granulocytes: 0 %
Lymphocytes Absolute: 1.7 10*3/uL (ref 0.7–3.1)
Lymphs: 24 %
MCH: 29.7 pg (ref 26.6–33.0)
MCHC: 33.3 g/dL (ref 31.5–35.7)
MCV: 89 fL (ref 79–97)
Monocytes Absolute: 0.6 10*3/uL (ref 0.1–0.9)
Monocytes: 8 %
Neutrophils Absolute: 4.5 10*3/uL (ref 1.4–7.0)
Neutrophils: 64 %
Platelets: 231 10*3/uL (ref 150–450)
RBC: 4.58 x10E6/uL (ref 3.77–5.28)
RDW: 11.8 % (ref 11.7–15.4)
WBC: 7.2 10*3/uL (ref 3.4–10.8)

## 2022-04-20 LAB — COMPREHENSIVE METABOLIC PANEL
ALT: 26 IU/L (ref 0–32)
AST: 23 IU/L (ref 0–40)
Albumin/Globulin Ratio: 2 (ref 1.2–2.2)
Albumin: 4.8 g/dL (ref 3.9–4.9)
Alkaline Phosphatase: 92 IU/L (ref 44–121)
BUN/Creatinine Ratio: 20 (ref 12–28)
BUN: 16 mg/dL (ref 8–27)
Bilirubin Total: 0.6 mg/dL (ref 0.0–1.2)
CO2: 21 mmol/L (ref 20–29)
Calcium: 9.6 mg/dL (ref 8.7–10.3)
Chloride: 102 mmol/L (ref 96–106)
Creatinine, Ser: 0.8 mg/dL (ref 0.57–1.00)
Globulin, Total: 2.4 g/dL (ref 1.5–4.5)
Glucose: 111 mg/dL — ABNORMAL HIGH (ref 70–99)
Potassium: 4.5 mmol/L (ref 3.5–5.2)
Sodium: 139 mmol/L (ref 134–144)
Total Protein: 7.2 g/dL (ref 6.0–8.5)
eGFR: 82 mL/min/{1.73_m2} (ref >59)

## 2022-04-20 LAB — TSH: TSH: 2.41 u[IU]/mL (ref 0.450–4.500)

## 2022-04-20 LAB — URINALYSIS, ROUTINE W REFLEX MICROSCOPIC
Bilirubin, UA: NEGATIVE
Glucose, UA: NEGATIVE
Nitrite, UA: NEGATIVE
RBC, UA: NEGATIVE
Specific Gravity, UA: 1.026 (ref 1.005–1.030)
Urobilinogen, Ur: 0.2 mg/dL (ref 0.2–1.0)
pH, UA: 5.5 (ref 5.0–7.5)

## 2022-04-20 LAB — MICROSCOPIC EXAMINATION
Bacteria, UA: NONE SEEN
Casts: NONE SEEN /lpf

## 2022-04-20 LAB — LIPID PANEL
Chol/HDL Ratio: 3.6 ratio (ref 0.0–4.4)
Cholesterol, Total: 231 mg/dL — ABNORMAL HIGH (ref 100–199)
HDL: 64 mg/dL (ref >39)
LDL Chol Calc (NIH): 131 mg/dL — ABNORMAL HIGH (ref 0–99)
Triglycerides: 205 mg/dL — ABNORMAL HIGH (ref 0–149)
VLDL Cholesterol Cal: 36 mg/dL (ref 5–40)

## 2022-04-20 LAB — HEMOGLOBIN A1C
Est. average glucose Bld gHb Est-mCnc: 120 mg/dL
Hgb A1c MFr Bld: 5.8 % — ABNORMAL HIGH (ref 4.8–5.6)

## 2022-06-14 ENCOUNTER — Ambulatory Visit
Admission: RE | Admit: 2022-06-14 | Discharge: 2022-06-14 | Disposition: A | Discharge status: Home / Self Care | Payer: BC Managed Care – PPO | Source: Ambulatory Visit | Attending: Internal Medicine | Admitting: Internal Medicine

## 2022-06-14 DIAGNOSIS — Z1231 Encounter for screening mammogram for malignant neoplasm of breast: Secondary | ICD-10-CM | POA: Diagnosis present

## 2022-11-17 ENCOUNTER — Ambulatory Visit
Admission: RE | Admit: 2022-11-17 | Discharge: 2022-11-17 | Disposition: A | Discharge status: Home / Self Care | Payer: BC Managed Care – PPO | Attending: Family Medicine | Admitting: Family Medicine

## 2022-11-17 ENCOUNTER — Ambulatory Visit
Admission: RE | Admit: 2022-11-17 | Discharge: 2022-11-17 | Disposition: A | Discharge status: Home / Self Care | Payer: BC Managed Care – PPO | Source: Ambulatory Visit | Attending: Family Medicine | Admitting: Family Medicine

## 2022-11-17 ENCOUNTER — Ambulatory Visit: Payer: BC Managed Care – PPO | Admitting: Family Medicine

## 2022-11-17 ENCOUNTER — Encounter: Payer: Self-pay | Admitting: Family Medicine

## 2022-11-17 VITALS — BP 128/78 | HR 97 | Ht 67.0 in | Wt 188.0 lb

## 2022-11-17 DIAGNOSIS — M79672 Pain in left foot: Secondary | ICD-10-CM | POA: Diagnosis present

## 2022-11-17 DIAGNOSIS — M79671 Pain in right foot: Secondary | ICD-10-CM

## 2022-11-17 DIAGNOSIS — M19071 Primary osteoarthritis, right ankle and foot: Secondary | ICD-10-CM | POA: Insufficient documentation

## 2022-11-17 MED ORDER — MELOXICAM 15 MG PO TABS: 15.0000 mg | ORAL_TABLET | Freq: Every day | ORAL | 0 refills | Status: DC

## 2022-11-17 NOTE — Patient Instructions (Addendum)
-   obtain x-rays today - can dose meloxicam as discussed - will reach out with x-ray results and next steps

## 2022-11-18 ENCOUNTER — Encounter: Payer: Self-pay | Admitting: Family Medicine

## 2022-11-18 NOTE — Telephone Encounter (Signed)
Please advise 

## 2022-11-21 ENCOUNTER — Encounter: Payer: Self-pay | Admitting: Family Medicine

## 2022-11-27 NOTE — Assessment & Plan Note (Signed)
>>  ASSESSMENT AND PLAN FOR BILATERAL FOOT PAIN WRITTEN ON 11/27/2022  3:16 PM BY Shaida Route, Ocie Bob, MD  Acute on chronic with worsened pain over past few weeks, denies trauma or overuse. Worse with prolonged time on feet.  Examination with hallux valgus noted, slight prominence at the dorsal midfoot on right, minimally tender at these areas. Provocative testing benign.  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  - Obtain dedicated x-rays - Advised meloxicam, patient will await x-ray results - Can contact with results and next steps - Would recommend 1-2 week schedule meloxicam followed by PRN usage, considerations for formal / home-based PT, and orthotics

## 2022-11-27 NOTE — Assessment & Plan Note (Addendum)
Acute on chronic with worsened pain over past few weeks, denies trauma or overuse. Worse with prolonged time on feet.  Examination with hallux valgus noted, slight prominence at the dorsal midfoot on right, minimally tender at these areas. Provocative testing benign.  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  - Obtain dedicated x-rays - Advised meloxicam, patient will await x-ray results - Can contact with results and next steps - Would recommend 1-2 week schedule meloxicam followed by PRN usage, considerations for formal / home-based PT, and orthotics

## 2022-11-27 NOTE — Progress Notes (Signed)
     Primary Care / Sports Medicine Office Visit  Patient Information:  Patient ID: Lisa Haas, female DOB: 1956/05/09 Age: 67 y.o. MRN: [id number]   Lisa Haas is a pleasant 67 y.o. female presenting with the following:  Chief Complaint  Patient presents with   Foot Pain    Top right for couple weeks, no known injury, swell after walking for long periods    Vitals:   11/17/22 1436  BP: 128/78  Pulse: 97  SpO2: 98%   Vitals:   11/17/22 1436  Weight: 188 lb (85.3 kg)  Height:  (1.702 m)   Body mass index is 29.44 kg/m.     Independent interpretation of notes and tests performed by another provider:   None  Procedures performed:   None  Pertinent History, Exam, Impression, and Recommendations:   Lisa Haas was seen today for foot pain.  Bilateral foot pain Assessment & Plan: Acute on chronic with worsened pain over past few weeks, denies trauma or overuse. Worse with prolonged time on feet.  Examination with hallux valgus noted, slight prominence at the dorsal midfoot on right, minimally tender at these areas. Provocative testing benign.  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  - Obtain dedicated x-rays - Advised meloxicam, patient will await x-ray results - Can contact with results and next steps - Would recommend 1-2 week schedule meloxicam followed by PRN usage, considerations for formal / home-based PT, and orthotics  Orders: -     DG Foot Complete Right; Future -     DG Foot Complete Left; Future -     Meloxicam; Take 1 tablet (15 mg total) by mouth daily.  Dispense: 30 tablet; Refill: 0     Orders & Medications Meds ordered this encounter  Medications   meloxicam (MOBIC) 15 MG tablet    Sig: Take 1 tablet (15 mg total) by mouth daily.    Dispense:  30 tablet    Refill:  0   Orders Placed This Encounter  Procedures   DG Foot Complete Right   DG Foot Complete Left     No follow-ups on  file.     Jerrol Banana, MD, [facility]   Primary Care Sports Medicine Primary Care and Sports Medicine at [facility]

## 2023-02-23 ENCOUNTER — Ambulatory Visit: Payer: BC Managed Care – PPO | Admitting: Family Medicine

## 2023-02-23 VITALS — BP 126/78 | HR 110 | Temp 98.7°F | Ht 67.0 in | Wt 188.0 lb

## 2023-02-23 DIAGNOSIS — M19071 Primary osteoarthritis, right ankle and foot: Secondary | ICD-10-CM

## 2023-02-23 DIAGNOSIS — J019 Acute sinusitis, unspecified: Secondary | ICD-10-CM | POA: Diagnosis not present

## 2023-02-23 DIAGNOSIS — M19072 Primary osteoarthritis, left ankle and foot: Secondary | ICD-10-CM

## 2023-02-23 MED ORDER — AZITHROMYCIN 250 MG PO TABS
ORAL_TABLET | ORAL | 0 refills | Status: AC
Start: 2023-02-23 — End: 2023-02-28

## 2023-02-23 MED ORDER — PROMETHAZINE-DM 6.25-15 MG/5ML PO SYRP: 5.0000 mL | ORAL_SOLUTION | Freq: Four times a day (QID) | ORAL | 0 refills | Status: DC | PRN

## 2023-02-23 MED ORDER — MELOXICAM 15 MG PO TABS
15.0000 mg | ORAL_TABLET | Freq: Every day | ORAL | 0 refills | Status: DC | PRN
Start: 2023-02-23 — End: 2023-04-24

## 2023-02-23 NOTE — Assessment & Plan Note (Signed)
Patient with symptom onset x 4-5 days, initially presented with significant pharyngeal pain, transition to primarily right-sided facial pressure, ear pain, deep, nonproductive cough, rhinorrhea.  Has had multiple COVID-positive contacts.  Examination with minimal tenderness at the right maxillary ethmoid sinus regions, right tympanic membrane retracted, erythema of the turbinates bilaterally, oropharynx erythematous without exudate, lung fields are clear bilaterally without wheezes, rales, rhonchi, 97% O2 sat noted, exam otherwise nonfocal.  Given the multiple exposure history, COVID was tested, negative.  Given the negative result most likely demonstrate some degree of acute rhinosinusitis, will cover for bacterial component.  Plan as follows: - Dose azithromycin for full course - Can use Rx cough medicine as-needed - Recommend Mucinex twice daily - Contact for any persistent symptoms, can consider chest x-ray at that time as clinically guided

## 2023-02-23 NOTE — Progress Notes (Signed)
Primary Care / Sports Medicine Office Visit  Patient Information:  Patient ID: Lisa Haas, female DOB: 1956/01/09 Age: 67 y.o. MRN: [id number]   Lisa Haas is a pleasant 67 y.o. female presenting with the following:  Chief Complaint  Patient presents with   Sore Throat    Sunday, Negative at home covid (expired) cough non productive painful, no fever. Sinus pressure is the worst    Vitals:   02/23/23 1308  BP: 126/78  Pulse: (!) 110  Temp: 98.7 F (37.1 C)  SpO2: 97%   Vitals:   02/23/23 1308  Weight: 188 lb (85.3 kg)  Height: 5\' 7"  (1.702 m)   Body mass index is 29.44 kg/m.  No results found.   Independent interpretation of notes and tests performed by another provider:   Independent interpretation of bilateral foot x-rays dated 11/17/2022 demonstrates primarily first MTP degenerative changes, secondary midfoot osteoarthritis, calcaneal spur noted bilaterally, no acute osseous processes identified  Procedures performed:   None  Pertinent History, Exam, Impression, and Recommendations:   [name] was seen today for sore throat.  Acute rhinosinusitis Assessment & Plan: Patient with symptom onset x 4-5 days, initially presented with significant pharyngeal pain, transition to primarily right-sided facial pressure, ear pain, deep, nonproductive cough, rhinorrhea.  Has had multiple COVID-positive contacts.  Examination with minimal tenderness at the right maxillary ethmoid sinus regions, right tympanic membrane retracted, erythema of the turbinates bilaterally, oropharynx erythematous without exudate, lung fields are clear bilaterally without wheezes, rales, rhonchi, 97% O2 sat noted, exam otherwise nonfocal.  Given the multiple exposure history, COVID was tested, negative.  Given the negative result most likely demonstrate some degree of acute rhinosinusitis, will cover for bacterial component.  Plan as follows: - Dose azithromycin for full course - Can use  Rx cough medicine as-needed - Recommend Mucinex twice daily - Contact for any persistent symptoms, can consider chest x-ray at that time as clinically guided  Orders: -     Promethazine-DM; Take 5 mLs by mouth 4 (four) times daily as needed for cough.  Dispense: 118 mL; Refill: 0 -     Azithromycin; Take 2 tablets on day 1, then 1 tablet daily on days 2 through 5  Dispense: 6 tablet; Refill: 0  Primary osteoarthritis of both feet Assessment & Plan: Follow-up from 11/2022 with bilateral primary midfoot pain, x-rays were obtained.  She still has intermittent symptomatology currently at the midfoot, we reviewed x-rays extensively correlating areas of degenerative changes with her symptomatology.  We reviewed both surgical and nonsurgical treatment strategies.  Plan as follows: - Use topical Voltaren gel (diclofenac 1%) up to 4 times daily as-needed - Can use meloxicam daily (with food) for pain not responding to Voltaren gel - Contact us if interested in pursuing orthotics, we would place a referral to podiatry for this  Orders: -     Meloxicam; Take 1 tablet (15 mg total) by mouth daily as needed for pain.  Dispense: 30 tablet; Refill: 0     Orders & Medications Meds ordered this encounter  Medications   promethazine-dextromethorphan (PROMETHAZINE-DM) 6.25-15 MG/5ML syrup    Sig: Take 5 mLs by mouth 4 (four) times daily as needed for cough.    Dispense:  118 mL    Refill:  0   meloxicam (MOBIC) 15 MG tablet    Sig: Take 1 tablet (15 mg total) by mouth daily as needed for pain.    Dispense:  30 tablet    Refill:  0   azithromycin (ZITHROMAX) 250 MG tablet    Sig: Take 2 tablets on day 1, then 1 tablet daily on days 2 through 5    Dispense:  6 tablet    Refill:  0   No orders of the defined types were placed in this encounter.    No follow-ups on file.     Jerrol Banana, MD, [facility]   Primary Care Sports Medicine Primary Care and Sports Medicine at [facility]

## 2023-02-23 NOTE — Patient Instructions (Addendum)
For Respiratory infection: - Start azithromycin, take for full course - Can use Rx cough medicine as-needed - Recommend Mucinex twice daily -Contact us for any persistent symptoms and follow-up as needed  For Foot pain: - Use topical Voltaren gel (diclofenac 1%) up to 4 times daily as-needed - Can use meloxicam daily (with food) for pain not responding to Voltaren gel - Contact us if interested in pursuing orthotics

## 2023-02-23 NOTE — Assessment & Plan Note (Signed)
Follow-up from 11/2022 with bilateral primary midfoot pain, x-rays were obtained.  She still has intermittent symptomatology currently at the midfoot, we reviewed x-rays extensively correlating areas of degenerative changes with her symptomatology.  We reviewed both surgical and nonsurgical treatment strategies.  Plan as follows: - Use topical Voltaren gel (diclofenac 1%) up to 4 times daily as-needed - Can use meloxicam daily (with food) for pain not responding to Voltaren gel - Contact us if interested in pursuing orthotics, we would place a referral to podiatry for this

## 2023-02-27 ENCOUNTER — Encounter: Payer: Self-pay | Admitting: Family Medicine

## 2023-02-27 NOTE — Telephone Encounter (Signed)
Please review.  KP

## 2023-04-02 ENCOUNTER — Other Ambulatory Visit: Payer: Self-pay | Admitting: Internal Medicine

## 2023-04-02 DIAGNOSIS — I10 Essential (primary) hypertension: Secondary | ICD-10-CM

## 2023-04-02 DIAGNOSIS — K219 Gastro-esophageal reflux disease without esophagitis: Secondary | ICD-10-CM

## 2023-04-02 DIAGNOSIS — E785 Hyperlipidemia, unspecified: Secondary | ICD-10-CM

## 2023-04-17 ENCOUNTER — Telehealth: Payer: Self-pay | Admitting: Internal Medicine

## 2023-04-17 NOTE — Telephone Encounter (Signed)
Pt is calling to report that she has turned 67 and is needing a welcome to medicare visit. Pt has a cpe scheduled for next Monday. Please advise [phone]

## 2023-04-24 ENCOUNTER — Encounter: Payer: Self-pay | Admitting: Internal Medicine

## 2023-04-24 ENCOUNTER — Ambulatory Visit (INDEPENDENT_AMBULATORY_CARE_PROVIDER_SITE_OTHER): Payer: BC Managed Care – PPO | Admitting: Internal Medicine

## 2023-04-24 VITALS — BP 124/76 | HR 91 | Ht 67.0 in | Wt 189.0 lb

## 2023-04-24 DIAGNOSIS — R7303 Prediabetes: Secondary | ICD-10-CM

## 2023-04-24 DIAGNOSIS — E785 Hyperlipidemia, unspecified: Secondary | ICD-10-CM | POA: Diagnosis not present

## 2023-04-24 DIAGNOSIS — E2839 Other primary ovarian failure: Secondary | ICD-10-CM

## 2023-04-24 DIAGNOSIS — K219 Gastro-esophageal reflux disease without esophagitis: Secondary | ICD-10-CM | POA: Diagnosis not present

## 2023-04-24 DIAGNOSIS — Z23 Encounter for immunization: Secondary | ICD-10-CM | POA: Diagnosis not present

## 2023-04-24 DIAGNOSIS — I10 Essential (primary) hypertension: Secondary | ICD-10-CM

## 2023-04-24 DIAGNOSIS — Z1231 Encounter for screening mammogram for malignant neoplasm of breast: Secondary | ICD-10-CM

## 2023-04-24 DIAGNOSIS — Z13820 Encounter for screening for osteoporosis: Secondary | ICD-10-CM

## 2023-04-24 LAB — POCT URINALYSIS DIPSTICK
Bilirubin, UA: NEGATIVE
Blood, UA: NEGATIVE
Glucose, UA: NEGATIVE
Ketones, UA: NEGATIVE
Leukocytes, UA: NEGATIVE
Nitrite, UA: NEGATIVE
Protein, UA: NEGATIVE
Spec Grav, UA: 1.015 (ref 1.010–1.025)
Urobilinogen, UA: 0.2 U/dL
pH, UA: 7 (ref 5.0–8.0)

## 2023-04-24 MED ORDER — OMEPRAZOLE 20 MG PO CPDR
20.0000 mg | DELAYED_RELEASE_CAPSULE | Freq: Every day | ORAL | 3 refills | Status: DC
Start: 2023-04-24 — End: 2024-04-25

## 2023-04-24 MED ORDER — AMLODIPINE BESYLATE 10 MG PO TABS
10.0000 mg | ORAL_TABLET | Freq: Every day | ORAL | 3 refills | Status: DC
Start: 2023-04-24 — End: 2024-04-25

## 2023-04-24 MED ORDER — ATORVASTATIN CALCIUM 40 MG PO TABS
40.0000 mg | ORAL_TABLET | Freq: Every day | ORAL | 3 refills | Status: AC
Start: 2023-04-24 — End: ?

## 2023-04-24 NOTE — Assessment & Plan Note (Signed)
LDL is  Lab Results  Component Value Date   LDLCALC 131 (H) 04/19/2022   Currently being treated with atorvastatin with good compliance and no concerns.

## 2023-04-24 NOTE — Assessment & Plan Note (Signed)
Managed with diet. Lab Results  Component Value Date   HGBA1C 5.8 (H) 04/19/2022

## 2023-04-24 NOTE — Assessment & Plan Note (Signed)
Reflux symptoms are minimal on current therapy - omeprazole. No red flag signs such as weight loss, n/v, melena

## 2023-04-24 NOTE — Patient Instructions (Signed)
Call [facility] to schedule your mammogram and Bone Density at [phone].

## 2023-04-24 NOTE — Progress Notes (Signed)
Date:  04/24/2023   Name:  Lisa Haas   DOB:  June 05, 1956   MRN:  [id number]   Chief Complaint: Annual Exam Lisa Haas is a 67 y.o. female who presents today for her Complete Annual Exam. She feels well. She reports exercising - some. She reports she is sleeping poorly. Breast complaints - none. 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  Mammogram: 06/2022 DEXA: none Colonoscopy: 10/2018  Health Maintenance Due  Topic Date Due   Medicare Annual Wellness (AWV)  Never done   DTaP/Tdap/Td (1 - Tdap) Never done   Zoster Vaccines- Shingrix (1 of 2) 03/25/1977   COVID-19 Vaccine (3 - Moderna risk series) 12/11/2019   DEXA SCAN  Never done    Immunization History  Administered Date(s) Administered   Fluad Trivalent(High Dose 65+) 04/24/2023   Influenza, Quadrivalent, Recombinant, Inj, Pf 05/15/2018   Influenza,inj,Quad PF,6+ Mos 04/08/2019, 04/08/2020, 04/15/2021, 04/19/2022   Influenza-Unspecified 04/21/2016, 05/22/2017   Moderna Sars-Covid-2 Vaccination 10/10/2019, 11/13/2019   PNEUMOCOCCAL CONJUGATE-20 04/24/2023   Zoster, Live 07/04/2012    Hypertension Pertinent negatives include no chest pain, headaches, palpitations or shortness of breath.  Gastroesophageal Reflux She reports no abdominal pain, no chest pain, no coughing or no wheezing. Pertinent negatives include no fatigue.  Hyperlipidemia Pertinent negatives include no chest pain or shortness of breath.    Lab Results  Component Value Date   NA 139 04/19/2022   K 4.5 04/19/2022   CO2 21 04/19/2022   GLUCOSE 111 (H) 04/19/2022   BUN 16 04/19/2022   CREATININE 0.80 04/19/2022   CALCIUM 9.6 04/19/2022   EGFR 82 04/19/2022   GFRNONAA 94 04/08/2020   Lab Results  Component Value Date   CHOL 231 (H) 04/19/2022   HDL 64 04/19/2022   LDLCALC 131 (H) 04/19/2022   TRIG 205 (H) 04/19/2022   CHOLHDL 3.6 04/19/2022   Lab Results  Component  Value Date   TSH 2.410 04/19/2022   Lab Results  Component Value Date   HGBA1C 5.8 (H) 04/19/2022   Lab Results  Component Value Date   WBC 7.2 04/19/2022   HGB 13.6 04/19/2022   HCT 40.9 04/19/2022   MCV 89 04/19/2022   PLT 231 04/19/2022   Lab Results  Component Value Date   ALT 26 04/19/2022   AST 23 04/19/2022   ALKPHOS 92 04/19/2022   BILITOT 0.6 04/19/2022   No results found for: "25OHVITD2", "25OHVITD3", "VD25OH"   Review of Systems  Constitutional:  Negative for chills, fatigue and fever.  HENT:  Negative for congestion, hearing loss, tinnitus, trouble swallowing and voice change.   Eyes:  Negative for visual disturbance.  Respiratory:  Negative for cough, chest tightness, shortness of breath and wheezing.   Cardiovascular:  Negative for chest pain, palpitations and leg swelling.  Gastrointestinal:  Negative for abdominal pain, constipation, diarrhea and vomiting.  Endocrine: Negative for polydipsia and polyuria.  Genitourinary:  Negative for dysuria, frequency, genital sores, vaginal bleeding and vaginal discharge.  Musculoskeletal:  Negative for arthralgias, gait problem and joint swelling.  Skin:  Negative for color change and rash.  Neurological:  Negative for dizziness, tremors, light-headedness and headaches.  Hematological:  Negative for adenopathy. Does not bruise/bleed easily.  Psychiatric/Behavioral:  Negative for dysphoric mood and sleep disturbance. The patient is not nervous/anxious.     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 Mohammed Kindle    Social History   Tobacco Use   Smoking status: Former    Current packs/day: 0.00    Types: Cigarettes    Quit date: 06/26/2009    Years since quitting: 13.8   Smokeless tobacco: Never  Vaping Use   Vaping status: Never Used  Substance Use Topics   Alcohol use: Yes    Alcohol/week: 4.0 standard drinks of alcohol    Types: 4 Cans of beer per week    Comment: "2-6 beers per week"   Drug use: Yes    Types: Marijuana    Comment: "a couple of times per month"     Medication list has been reviewed and updated.  Current Meds  Medication Sig   Aspirin-Acetaminophen-Caffeine (GOODYS EXTRA STRENGTH PO) Take 1 tablet by mouth daily.   Multiple Vitamins-Minerals (MULTIVITAMIN WITH MINERALS) tablet Take 1 tablet by mouth daily.   [DISCONTINUED] amLODipine (NORVASC) 10 MG tablet Take 1 tablet by mouth once daily   [DISCONTINUED] atorvastatin (LIPITOR) 40 MG tablet Take 1 tablet by mouth once daily   [DISCONTINUED] meloxicam (MOBIC) 15 MG tablet Take 1 tablet (15 mg total) by mouth daily as needed for pain.   [DISCONTINUED] omeprazole (PRILOSEC) 20 MG capsule Take 1 capsule by mouth once daily       04/24/2023    8:01 AM 04/19/2022    8:04 AM 04/15/2021    8:28 AM  GAD 7 : Generalized Anxiety Score  Nervous, Anxious, on Edge 0 0 0  Control/stop worrying 0 0 0  Worry too much - different things 0 0 0  Trouble relaxing 0 0 0  Restless 0 0 0  Easily annoyed or irritable 0 0 0  Afraid - awful might happen 0 0 0  Total GAD 7 Score 0 0 0  Anxiety Difficulty Not difficult at all Not difficult at all        04/24/2023    8:01 AM 04/19/2022    8:03 AM 04/15/2021    8:27 AM  Depression screen PHQ 2/9   Decreased Interest 0 0 0  Down, Depressed, Hopeless 0 1 0  PHQ - 2 Score 0 1 0  Altered sleeping 0 1 2  Tired, decreased energy 0 1 0  Change in appetite 0 0 0  Feeling bad or failure about yourself  0 0 0  Trouble concentrating 0 0 0  Moving slowly or fidgety/restless 0 0 0  Suicidal thoughts 0 0 0  PHQ-9 Score 0 3 2  Difficult doing work/chores Not difficult at all Not difficult at all Not difficult at all    BP Readings from Last 3 Encounters:  04/24/23 124/76  02/23/23 126/78  11/17/22 128/78    Physical Exam Vitals and nursing note reviewed.  Constitutional:      General:  She is not in acute distress.    Appearance: She is well-developed.  HENT:     Head: Normocephalic and atraumatic.     Right Ear: Tympanic membrane and ear canal normal.     Left Ear: Tympanic membrane and ear canal normal.     Nose:     Right Sinus: No maxillary sinus tenderness.     Left Sinus: No maxillary sinus tenderness.  Eyes:     General: No scleral icterus.       Right eye: No discharge.        Left eye: No discharge.     Conjunctiva/sclera: Conjunctivae normal.  Neck:     Thyroid: No thyromegaly.     Vascular: No carotid bruit.  Cardiovascular:     Rate and Rhythm: Normal rate and regular rhythm.     Pulses: Normal pulses.     Heart sounds: Normal heart sounds.  Pulmonary:     Effort: Pulmonary effort is normal. No respiratory distress.     Breath sounds: No wheezing.  Chest:  Breasts:    Right: No mass, nipple discharge, skin change or tenderness.     Left: No mass, nipple discharge, skin change or tenderness.  Abdominal:     General: Bowel sounds are normal.     Palpations: Abdomen is soft.     Tenderness: There is no abdominal tenderness.  Musculoskeletal:     Cervical back: Normal range of motion. No erythema.     Right lower leg: No edema.     Left lower leg: No edema.  Lymphadenopathy:     Cervical: No cervical adenopathy.  Skin:    General: Skin is warm and dry.      Findings: No rash.  Neurological:     Mental Status: She is alert and oriented to person, place, and time.     Cranial Nerves: No cranial nerve deficit.     Sensory: No sensory deficit.     Deep Tendon Reflexes: Reflexes are normal and symmetric.  Psychiatric:        Attention and Perception: Attention normal.        Mood and Affect: Mood normal.     Wt Readings from Last 3 Encounters:  04/24/23 189 lb (85.7 kg)  02/23/23 188 lb (85.3 kg)  11/17/22 188 lb (85.3 kg)    BP 124/76   Pulse 91   Ht 5\' 7"  (1.702 m)   Wt 189 lb (85.7 kg)   SpO2 98%   BMI 29.60 kg/m   Assessment and Plan:  Problem List Items Addressed This Visit       Unprioritized   Prediabetes    Managed with diet. Lab Results  Component Value Date   HGBA1C 5.8 (H) 04/19/2022         Relevant Orders   Hemoglobin A1c   GERD without esophagitis (Chronic)    Reflux symptoms are minimal on current therapy - omeprazole. No red flag signs such as weight loss, n/v, melena       Relevant Medications   omeprazole (PRILOSEC) 20 MG capsule   Other Relevant Orders   CBC with Differential/Platelet   Essential (primary) hypertension - Primary (Chronic)    Normal exam with stable BP on amlodipine. No concerns or side effects to current medication. No change in regimen; continue low sodium diet.       Relevant Medications   amLODipine (NORVASC) 10 MG tablet   atorvastatin (LIPITOR) 40 MG tablet   Other Relevant Orders  Comprehensive metabolic panel   TSH   POCT urinalysis dipstick   Dyslipidemia (Chronic)    LDL is  Lab Results  Component Value Date   LDLCALC 131 (H) 04/19/2022   Currently being treated with atorvastatin with good compliance and no concerns.       Relevant Medications   atorvastatin (LIPITOR) 40 MG tablet   Other Relevant Orders   Lipid panel   Other Visit Diagnoses     Encounter for screening mammogram for breast cancer       Relevant Orders   MM 3D SCREENING  MAMMOGRAM BILATERAL BREAST   Encounter for screening for osteoporosis       Relevant Orders   DG Bone Density   Need for vaccination for pneumococcus       Relevant Orders   Pneumococcal conjugate vaccine 20-valent (Completed)   Ovarian failure due to menopause       Relevant Orders   VITAMIN D 25 Hydroxy (Vit-D Deficiency, Fractures)   Need for influenza vaccination       Relevant Orders   Flu Vaccine Trivalent High Dose (Fluad) (Completed)       Return in about 6 months (around 10/22/2023) for HTN, Welcome to Medicare.    Reubin Milan, MD [facility] Primary Care and Sports Medicine [place]

## 2023-04-24 NOTE — Assessment & Plan Note (Signed)
Normal exam with stable BP on amlodipine. No concerns or side effects to current medication. No change in regimen; continue low sodium diet.

## 2023-04-25 LAB — COMPREHENSIVE METABOLIC PANEL
ALT: 28 IU/L (ref 0–32)
AST: 24 IU/L (ref 0–40)
Albumin: 4.5 g/dL (ref 3.9–4.9)
Alkaline Phosphatase: 96 IU/L (ref 44–121)
BUN/Creatinine Ratio: 20 (ref 12–28)
BUN: 15 mg/dL (ref 8–27)
Bilirubin Total: 0.4 mg/dL (ref 0.0–1.2)
CO2: 22 mmol/L (ref 20–29)
Calcium: 9.3 mg/dL (ref 8.7–10.3)
Chloride: 104 mmol/L (ref 96–106)
Creatinine, Ser: 0.75 mg/dL (ref 0.57–1.00)
Globulin, Total: 2.7 g/dL (ref 1.5–4.5)
Glucose: 109 mg/dL — ABNORMAL HIGH (ref 70–99)
Potassium: 4.1 mmol/L (ref 3.5–5.2)
Sodium: 141 mmol/L (ref 134–144)
Total Protein: 7.2 g/dL (ref 6.0–8.5)
eGFR: 88 mL/min/{1.73_m2} (ref >59)

## 2023-04-25 LAB — CBC WITH DIFFERENTIAL/PLATELET
Basophils Absolute: 0 10*3/uL (ref 0.0–0.2)
Basos: 1 %
EOS (ABSOLUTE): 0.1 10*3/uL (ref 0.0–0.4)
Eos: 1 %
Hematocrit: 42.1 % (ref 34.0–46.6)
Hemoglobin: 14 g/dL (ref 11.1–15.9)
Immature Grans (Abs): 0 10*3/uL (ref 0.0–0.1)
Immature Granulocytes: 0 %
Lymphocytes Absolute: 1.7 10*3/uL (ref 0.7–3.1)
Lymphs: 20 %
MCH: 29.5 pg (ref 26.6–33.0)
MCHC: 33.3 g/dL (ref 31.5–35.7)
MCV: 89 fL (ref 79–97)
Monocytes Absolute: 0.6 10*3/uL (ref 0.1–0.9)
Monocytes: 7 %
Neutrophils Absolute: 6.1 10*3/uL (ref 1.4–7.0)
Neutrophils: 71 %
Platelets: 259 10*3/uL (ref 150–450)
RBC: 4.74 x10E6/uL (ref 3.77–5.28)
RDW: 11.9 % (ref 11.7–15.4)
WBC: 8.5 10*3/uL (ref 3.4–10.8)

## 2023-04-25 LAB — HEMOGLOBIN A1C
Est. average glucose Bld gHb Est-mCnc: 123 mg/dL
Hgb A1c MFr Bld: 5.9 % — ABNORMAL HIGH (ref 4.8–5.6)

## 2023-04-25 LAB — LIPID PANEL
Chol/HDL Ratio: 3.9 ratio (ref 0.0–4.4)
Cholesterol, Total: 233 mg/dL — ABNORMAL HIGH (ref 100–199)
HDL: 60 mg/dL (ref >39)
LDL Chol Calc (NIH): 123 mg/dL — ABNORMAL HIGH (ref 0–99)
Triglycerides: 287 mg/dL — ABNORMAL HIGH (ref 0–149)
VLDL Cholesterol Cal: 50 mg/dL — ABNORMAL HIGH (ref 5–40)

## 2023-04-25 LAB — VITAMIN D 25 HYDROXY (VIT D DEFICIENCY, FRACTURES): Vit D, 25-Hydroxy: 32.9 ng/mL (ref 30.0–100.0)

## 2023-04-25 LAB — TSH: TSH: 3 u[IU]/mL (ref 0.450–4.500)

## 2023-07-13 ENCOUNTER — Ambulatory Visit
Admission: RE | Admit: 2023-07-13 | Discharge: 2023-07-13 | Disposition: A | Discharge status: Home / Self Care | Payer: Medicare Other | Source: Ambulatory Visit | Attending: Internal Medicine | Admitting: Internal Medicine

## 2023-07-13 DIAGNOSIS — Z1231 Encounter for screening mammogram for malignant neoplasm of breast: Secondary | ICD-10-CM | POA: Insufficient documentation

## 2023-07-28 ENCOUNTER — Ambulatory Visit (INDEPENDENT_AMBULATORY_CARE_PROVIDER_SITE_OTHER): Payer: Medicare Other | Admitting: Internal Medicine

## 2023-07-28 VITALS — BP 128/78 | HR 107 | Ht 67.0 in

## 2023-07-28 DIAGNOSIS — J0100 Acute maxillary sinusitis, unspecified: Secondary | ICD-10-CM | POA: Diagnosis not present

## 2023-07-28 DIAGNOSIS — H66001 Acute suppurative otitis media without spontaneous rupture of ear drum, right ear: Secondary | ICD-10-CM | POA: Diagnosis not present

## 2023-07-28 MED ORDER — AZITHROMYCIN 250 MG PO TABS
ORAL_TABLET | ORAL | 0 refills | Status: AC
Start: 2023-07-28 — End: 2023-08-02

## 2023-07-28 NOTE — Progress Notes (Signed)
Date:  07/28/2023   Name:  Lisa Haas   DOB:  [birth year]   MRN:  [id number]   Chief Complaint: Sinusitis (Pt in clinic today c/o sinus issues. Pt state symptoms started on Monday 12/16. She states congestion and nausea started on Wednesday 12/18. Pt states nausea has gotten better.) Home Covid test negative yesterday.  Sinus Problem This is a new problem. Episode onset: 5 days ago. There has been no fever. The pain is mild. Associated symptoms include congestion, ear pain, headaches, sinus pressure and a sore throat. Pertinent negatives include no chills, coughing, hoarse voice or shortness of breath. Past treatments include nothing.    Review of Systems  Constitutional:  Negative for chills, fatigue and fever.  HENT:  Positive for congestion, ear pain, sinus pressure and sore throat. Negative for hoarse voice and trouble swallowing.   Respiratory:  Negative for cough, shortness of breath and wheezing.   Cardiovascular:  Negative for chest pain and palpitations.  Gastrointestinal:  Positive for nausea. Negative for vomiting.  Neurological:  Positive for headaches. Negative for dizziness.     Lab Results  Component Value Date   NA 141 04/24/2023   K 4.1 04/24/2023   CO2 22 04/24/2023   GLUCOSE 109 (H) 04/24/2023   BUN 15 04/24/2023   CREATININE 0.75 04/24/2023   CALCIUM 9.3 04/24/2023   EGFR 88 04/24/2023   GFRNONAA 94 04/08/2020   Lab Results  Component Value Date   CHOL 233 (H) 04/24/2023   HDL 60 04/24/2023   LDLCALC 123 (H) 04/24/2023   TRIG 287 (H) 04/24/2023   CHOLHDL 3.9 04/24/2023   Lab Results  Component Value Date   TSH 3.000 04/24/2023   Lab Results  Component Value Date   HGBA1C 5.9 (H) 04/24/2023   Lab Results  Component Value Date   WBC 8.5 04/24/2023   HGB 14.0 04/24/2023   HCT 42.1 04/24/2023   MCV 89 04/24/2023   PLT 259 04/24/2023   Lab Results  Component Value Date   ALT 28 04/24/2023   AST 24 04/24/2023   ALKPHOS 96 04/24/2023    BILITOT 0.4 04/24/2023   Lab Results  Component Value Date   VD25OH 32.9 04/24/2023     Patient Active Problem List   Diagnosis Date Noted   Prediabetes 04/24/2023   Primary osteoarthritis of both feet 11/17/2022   GERD without esophagitis 04/08/2019   Grade II hemorrhoids 10/03/2018   Bloodgood disease 06/22/2015   Dyslipidemia 06/22/2015   Essential (primary) hypertension 06/22/2015   History of adenomatous polyp of colon 06/22/2015   Hot flash, menopausal 06/22/2015   Heart palpitations 06/22/2015   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 Mohammed Kindle    Social History   Tobacco Use   Smoking status: Former    Current packs/day: 0.00    Types: Cigarettes    Quit date: 06/26/2009    Years since quitting: 14.0   Smokeless tobacco: Never  Vaping Use   Vaping status: Never Used  Substance Use Topics   Alcohol use: Yes    Alcohol/week: 4.0 standard drinks of alcohol    Types: 4 Cans of beer per week  Comment: "2-6 beers per week"   Drug use: Yes    Types: Marijuana    Comment: "a couple of times per month"     Medication list has been reviewed and updated.  Current Meds  Medication Sig   amLODipine (NORVASC) 10 MG tablet Take 1 tablet (10 mg total) by mouth daily.   Aspirin-Acetaminophen-Caffeine (GOODYS EXTRA STRENGTH PO) Take 1 tablet by mouth daily.   atorvastatin (LIPITOR) 40 MG tablet Take 1 tablet (40 mg total) by mouth daily.   azithromycin (ZITHROMAX Z-PAK) 250 MG tablet UAD   omeprazole (PRILOSEC) 20 MG capsule Take 1 capsule (20 mg total) by mouth daily.       07/28/2023    1:42 PM 04/24/2023    8:01 AM 04/19/2022    8:04 AM 04/15/2021    8:28 AM  GAD 7 : Generalized Anxiety  Score  Nervous, Anxious, on Edge 0 0 0 0  Control/stop worrying 0 0 0 0  Worry too much - different things 0 0 0 0  Trouble relaxing 0 0 0 0  Restless 0 0 0 0  Easily annoyed or irritable 0 0 0 0  Afraid - awful might happen 0 0 0 0  Total GAD 7 Score 0 0 0 0  Anxiety Difficulty Not difficult at all Not difficult at all Not difficult at all        07/28/2023    1:42 PM 04/24/2023    8:01 AM 04/19/2022    8:03 AM  Depression screen PHQ 2/9  Decreased Interest 0 0 0  Down, Depressed, Hopeless 0 0 1  PHQ - 2 Score 0 0 1  Altered sleeping 0 0 1  Tired, decreased energy 0 0 1  Change in appetite 0 0 0  Feeling bad or failure about yourself  0 0 0  Trouble concentrating 0 0 0  Moving slowly or fidgety/restless 0 0 0  Suicidal thoughts 0 0 0  PHQ-9 Score 0 0 3  Difficult doing work/chores Not difficult at all Not difficult at all Not difficult at all    BP Readings from Last 3 Encounters:  07/28/23 128/78  04/24/23 124/76  02/23/23 126/78    Physical Exam Vitals and nursing note reviewed.  Constitutional:      General: She is not in acute distress.    Appearance: Normal appearance. She is well-developed.  HENT:     Head: Normocephalic and atraumatic.     Right Ear: No middle ear effusion. Tympanic membrane is erythematous and retracted.     Left Ear:  No middle ear effusion. Tympanic membrane is not erythematous or retracted.     Nose:     Right Sinus: No maxillary sinus tenderness or frontal sinus tenderness.     Left Sinus: No maxillary sinus tenderness or frontal sinus tenderness.     Mouth/Throat:     Pharynx: Posterior oropharyngeal erythema present.     Comments: Yellow posterior pharyngeal mucus Cardiovascular:     Rate and Rhythm: Normal rate and regular rhythm.  Pulmonary:     Effort: Pulmonary effort is normal. No respiratory distress.     Breath sounds: No wheezing or rhonchi.  Musculoskeletal:     Cervical back: Normal range of motion.  Lymphadenopathy:      Cervical: No cervical adenopathy.  Skin:    General: Skin is warm and dry.     Findings: No rash.  Neurological:     Mental Status: She is alert and oriented to  person, place, and time.  Psychiatric:        Mood and Affect: Mood normal.        Behavior: Behavior normal.     Wt Readings from Last 3 Encounters:  04/24/23 189 lb (85.7 kg)  02/23/23 188 lb (85.3 kg)  11/17/22 188 lb (85.3 kg)    BP 128/78   Pulse (!) 107   Ht 5\' 7"  (1.702 m)   SpO2 98%   BMI 29.60 kg/m   Assessment and Plan:  Problem List Items Addressed This Visit   None Visit Diagnoses       Acute non-recurrent maxillary sinusitis    -  Primary   Recommend fluids, Tylenol if needed Begin otc Nasocort NS if congestion worsens   Relevant Medications   azithromycin (ZITHROMAX Z-PAK) 250 MG tablet     Non-recurrent acute suppurative otitis media of right ear without spontaneous rupture of tympanic membrane       Relevant Medications   azithromycin (ZITHROMAX Z-PAK) 250 MG tablet       No follow-ups on file.    Reubin Milan, MD [facility] [place]

## 2023-07-28 NOTE — Patient Instructions (Signed)
Nasocort nasal spray - over the counter.  Push fluids.

## 2023-09-15 ENCOUNTER — Encounter: Payer: Self-pay | Admitting: Internal Medicine

## 2023-09-18 ENCOUNTER — Encounter: Payer: Self-pay | Admitting: Internal Medicine

## 2023-09-18 ENCOUNTER — Ambulatory Visit (INDEPENDENT_AMBULATORY_CARE_PROVIDER_SITE_OTHER): Payer: Medicare Other | Admitting: Internal Medicine

## 2023-09-18 VITALS — BP 126/70 | HR 103 | Ht 67.0 in | Wt 189.0 lb

## 2023-09-18 DIAGNOSIS — R194 Change in bowel habit: Secondary | ICD-10-CM

## 2023-09-18 DIAGNOSIS — R140 Abdominal distension (gaseous): Secondary | ICD-10-CM

## 2023-09-18 NOTE — Patient Instructions (Signed)
 Start Colace or Miralax daily to help regulate bowel movement.

## 2023-09-18 NOTE — Progress Notes (Signed)
 Date:  09/18/2023   Name:  Lisa Haas   DOB:  03/28/1956   MRN:  [id number]   Chief Complaint: Abdominal Pain (Abdominal Discomfort, Gas, Nausea X 3 weeks. Patient still has gallbladder and appendix. Patient has low abdominal pain under her navel. No urination concerns. Having constipation, and diarrhea on and off. Feels like cannot empty bowel completely. Has tried GasX and IBGard. )  Abdominal Pain This is a new problem. The current episode started 1 to 4 weeks ago. The onset quality is sudden. The problem occurs every several days. The problem has been unchanged. The pain is located in the generalized abdominal region. The pain is mild. The quality of the pain is a sensation of fullness. The abdominal pain does not radiate. Associated symptoms include belching, constipation, diarrhea, flatus and headaches. Pertinent negatives include no anorexia, dysuria, fever, frequency, hematochezia, nausea, vomiting or weight loss. Nothing aggravates the pain. The pain is relieved by Nothing. Treatments tried: on PPI daily; tried IBDgard.  No recent travel, no suspicious food intake, has community well water.  Colonoscopy in 2020 with one hyperplastic polyp.  Review of Systems  Constitutional:  Negative for appetite change, chills, fatigue, fever, unexpected weight change and weight loss.  Respiratory:  Negative for chest tightness and shortness of breath.   Cardiovascular:  Negative for chest pain.  Gastrointestinal:  Positive for abdominal pain, constipation, diarrhea and flatus. Negative for anorexia, hematochezia, nausea and vomiting.  Genitourinary:  Negative for dysuria and frequency.  Neurological:  Positive for headaches. Negative for dizziness.     Lab Results  Component Value Date   NA 141 04/24/2023   K 4.1 04/24/2023   CO2 22 04/24/2023   GLUCOSE 109 (H) 04/24/2023   BUN 15 04/24/2023   CREATININE 0.75 04/24/2023   CALCIUM  9.3 04/24/2023   EGFR 88 04/24/2023   GFRNONAA 94  04/08/2020   Lab Results  Component Value Date   CHOL 233 (H) 04/24/2023   HDL 60 04/24/2023   LDLCALC 123 (H) 04/24/2023   TRIG 287 (H) 04/24/2023   CHOLHDL 3.9 04/24/2023   Lab Results  Component Value Date   TSH 3.000 04/24/2023   Lab Results  Component Value Date   HGBA1C 5.9 (H) 04/24/2023   Lab Results  Component Value Date   WBC 8.5 04/24/2023   HGB 14.0 04/24/2023   HCT 42.1 04/24/2023   MCV 89 04/24/2023   PLT 259 04/24/2023   Lab Results  Component Value Date   ALT 28 04/24/2023   AST 24 04/24/2023   ALKPHOS 96 04/24/2023   BILITOT 0.4 04/24/2023   Lab Results  Component Value Date   VD25OH 32.9 04/24/2023     Patient Active Problem List   Diagnosis Date Noted   Prediabetes 04/24/2023   Primary osteoarthritis of both feet 11/17/2022   GERD without esophagitis 04/08/2019   Grade II hemorrhoids 10/03/2018   Bloodgood disease 06/22/2015   Dyslipidemia 06/22/2015   Essential (primary) hypertension 06/22/2015   History of adenomatous polyp of colon 06/22/2015   Hot flash, menopausal 06/22/2015   Heart palpitations 06/22/2015   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 Maeola Schmidt  Social History   Tobacco Use   Smoking status: Former    Current packs/day: 0.00    Types: Cigarettes    Quit date: 06/26/2009    Years since quitting: 14.2   Smokeless tobacco: Never  Vaping Use   Vaping status: Never Used  Substance Use Topics   Alcohol use: Yes    Alcohol/week: 4.0 standard drinks of alcohol    Types: 4 Cans of beer per week    Comment: "2-6 beers per week"   Drug use: Yes    Types: Marijuana    Comment: "a couple of times per month"     Medication list has been reviewed  and updated.  Current Meds  Medication Sig   amLODipine  (NORVASC ) 10 MG tablet Take 1 tablet (10 mg total) by mouth daily.   Aspirin-Acetaminophen-Caffeine (GOODYS EXTRA STRENGTH PO) Take 1 tablet by mouth daily.   atorvastatin  (LIPITOR) 40 MG tablet Take 1 tablet (40 mg total) by mouth daily.   omeprazole  (PRILOSEC) 20 MG capsule Take 1 capsule (20 mg total) by mouth daily.       09/18/2023    1:41 PM 07/28/2023    1:42 PM 04/24/2023    8:01 AM 04/19/2022    8:04 AM  GAD 7 : Generalized Anxiety Score  Nervous, Anxious, on Edge 0 0 0 0  Control/stop worrying 0 0 0 0  Worry too much - different things 0 0 0 0  Trouble relaxing 0 0 0 0  Restless 0 0 0 0  Easily annoyed or irritable 0 0 0 0  Afraid - awful might happen 0 0 0 0  Total GAD 7 Score 0 0 0 0  Anxiety Difficulty Not difficult at all Not difficult at all Not difficult at all Not difficult at all       09/18/2023    1:40 PM 07/28/2023    1:42 PM 04/24/2023    8:01 AM  Depression screen PHQ 2/9  Decreased Interest 0 0 0  Down, Depressed, Hopeless 0 0 0  PHQ - 2 Score 0 0 0  Altered sleeping 0 0 0  Tired, decreased energy 0 0 0  Change in appetite 0 0 0  Feeling bad or failure about yourself  0 0 0  Trouble concentrating 0 0 0  Moving slowly or fidgety/restless 0 0 0  Suicidal thoughts 0 0 0  PHQ-9 Score 0 0 0  Difficult doing work/chores Not difficult at all Not difficult at all Not difficult at all    BP Readings from Last 3 Encounters:  09/18/23 126/70  07/28/23 128/78  04/24/23 124/76    Physical Exam Vitals and nursing note reviewed.  Constitutional:      General: She is not in acute distress.    Appearance: She is well-developed.  HENT:     Head: Normocephalic and atraumatic.  Cardiovascular:     Rate and Rhythm: Normal rate and regular rhythm.  Pulmonary:     Effort: Pulmonary effort is normal. No respiratory distress.  Abdominal:     General: Abdomen is flat. Bowel sounds are normal.      Palpations: Abdomen is soft.     Tenderness: There is abdominal tenderness in the epigastric area. There is no right CVA tenderness, left CVA tenderness, guarding or rebound.  Skin:    General: Skin is warm and dry.     Findings: No rash.  Neurological:     Mental Status: She is alert and oriented to person, place,  and time.  Psychiatric:        Mood and Affect: Mood normal.        Behavior: Behavior normal.     Wt Readings from Last 3 Encounters:  09/18/23 189 lb (85.7 kg)  04/24/23 189 lb (85.7 kg)  02/23/23 188 lb (85.3 kg)    BP 126/70   Pulse (!) 103   Ht 5\' 7"  (1.702 m)   Wt 189 lb (85.7 kg)   SpO2 98%   Breastfeeding No   BMI 29.60 kg/m   Assessment and Plan:  Problem List Items Addressed This Visit   None Visit Diagnoses       Change in bowel habits    -  Primary   recommend Colace or Miralax daily to help regulate constipation Bland diet   Relevant Orders   US  Abdomen Complete     Abdominal bloating       suspect gall bladder disease will get US  abdomen to further evaluate low fat/low protein bland diet for now continue PPI   Relevant Orders   US  Abdomen Complete       No follow-ups on file.    Sheron Dixons, MD [facility] [place]

## 2023-09-20 ENCOUNTER — Ambulatory Visit
Admission: RE | Admit: 2023-09-20 | Discharge: 2023-09-20 | Disposition: A | Discharge status: Home / Self Care | Payer: Medicare Other | Source: Ambulatory Visit | Attending: Internal Medicine | Admitting: Internal Medicine

## 2023-09-20 DIAGNOSIS — R194 Change in bowel habit: Secondary | ICD-10-CM | POA: Insufficient documentation

## 2023-09-20 DIAGNOSIS — R140 Abdominal distension (gaseous): Secondary | ICD-10-CM | POA: Insufficient documentation

## 2023-09-21 ENCOUNTER — Encounter: Payer: Self-pay | Admitting: Internal Medicine

## 2023-10-04 ENCOUNTER — Other Ambulatory Visit: Payer: Self-pay

## 2023-10-04 ENCOUNTER — Encounter: Payer: Self-pay | Admitting: Internal Medicine

## 2023-10-04 DIAGNOSIS — R140 Abdominal distension (gaseous): Secondary | ICD-10-CM

## 2023-10-04 NOTE — Telephone Encounter (Signed)
 Please review.  KP

## 2023-10-09 ENCOUNTER — Telehealth: Payer: Self-pay

## 2023-10-09 NOTE — Telephone Encounter (Signed)
 The patient called in wanting to schedule appointment. I inform her that we are not accepting no newer patient currently. She can call [facility] to schedule appointment. The patient stated that all her provider is with [facility], and she want to keep her provider with [facility]. I inform her that I totally understand, and she can call [facility] instead. The patient wanted to know why she must go to [place]. She wants to go to the practice in [place]. I told her that the provider for that office is leaving, and I don't know when another provider will be there.

## 2023-10-10 ENCOUNTER — Telehealth: Payer: Self-pay | Admitting: Internal Medicine

## 2023-10-10 NOTE — Telephone Encounter (Signed)
 Called to explain that [facility] is losing three providers.  She really wants to stay in the [facility] system but does not want to go to [place]. She is still having symptoms but not severe.  Recommend she try a probiotic daily.   She will consider going to [facility] but will let me know in a week or two where to send a referral if still needed.

## 2023-10-22 ENCOUNTER — Other Ambulatory Visit: Payer: Self-pay | Admitting: Internal Medicine

## 2023-10-22 DIAGNOSIS — R194 Change in bowel habit: Secondary | ICD-10-CM

## 2023-10-22 DIAGNOSIS — R140 Abdominal distension (gaseous): Secondary | ICD-10-CM

## 2023-10-22 NOTE — Progress Notes (Unsigned)
 Date:  10/22/2023   Name:  Lisa Haas   DOB:  Jan 29, 1956   MRN:  [id number]   Chief Complaint: No chief complaint on file.  HPI  Review of Systems   Lab Results  Component Value Date   NA 141 04/24/2023   K 4.1 04/24/2023   CO2 22 04/24/2023   GLUCOSE 109 (H) 04/24/2023   BUN 15 04/24/2023   CREATININE 0.75 04/24/2023   CALCIUM 9.3 04/24/2023   EGFR 88 04/24/2023   GFRNONAA 94 04/08/2020   Lab Results  Component Value Date   CHOL 233 (H) 04/24/2023   HDL 60 04/24/2023   LDLCALC 123 (H) 04/24/2023   TRIG 287 (H) 04/24/2023   CHOLHDL 3.9 04/24/2023   Lab Results  Component Value Date   TSH 3.000 04/24/2023   Lab Results  Component Value Date   HGBA1C 5.9 (H) 04/24/2023   Lab Results  Component Value Date   WBC 8.5 04/24/2023   HGB 14.0 04/24/2023   HCT 42.1 04/24/2023   MCV 89 04/24/2023   PLT 259 04/24/2023   Lab Results  Component Value Date   ALT 28 04/24/2023   AST 24 04/24/2023   ALKPHOS 96 04/24/2023   BILITOT 0.4 04/24/2023   Lab Results  Component Value Date   VD25OH 32.9 04/24/2023     Patient Active Problem List   Diagnosis Date Noted   Prediabetes 04/24/2023   Primary osteoarthritis of both feet 11/17/2022   GERD without esophagitis 04/08/2019   Grade II hemorrhoids 10/03/2018   Bloodgood disease 06/22/2015   Dyslipidemia 06/22/2015   Essential (primary) hypertension 06/22/2015   History of adenomatous polyp of colon 06/22/2015   Hot flash, menopausal 06/22/2015   Heart palpitations 06/22/2015   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 Mohammed Kindle    Social History   Tobacco Use   Smoking status: Former    Current packs/day: 0.00    Types: Cigarettes     Quit date: 06/26/2009    Years since quitting: 14.3   Smokeless tobacco: Never  Vaping Use   Vaping status: Never Used  Substance Use Topics   Alcohol use: Yes    Alcohol/week: 4.0 standard drinks of alcohol    Types: 4 Cans of beer per week    Comment: "2-6 beers per week"   Drug use: Yes    Types: Marijuana    Comment: "a couple of times per month"     Medication list has been reviewed and updated.  No outpatient medications have been marked as taking for the 10/22/23 encounter (Orders Only) with Reubin Milan, MD.       09/18/2023    1:41 PM 07/28/2023    1:42 PM 04/24/2023    8:01 AM 04/19/2022    8:04 AM  GAD 7 : Generalized Anxiety Score  Nervous, Anxious, on Edge 0 0 0 0  Control/stop worrying 0 0 0 0  Worry too much - different things 0 0 0 0  Trouble relaxing 0 0 0 0  Restless 0 0 0 0  Easily annoyed or irritable 0 0 0 0  Afraid - awful might happen 0 0  0 0  Total GAD 7 Score 0 0 0 0  Anxiety Difficulty Not difficult at all Not difficult at all Not difficult at all Not difficult at all       09/18/2023    1:40 PM 07/28/2023    1:42 PM 04/24/2023    8:01 AM  Depression screen PHQ 2/9  Decreased Interest 0 0 0  Down, Depressed, Hopeless 0 0 0  PHQ - 2 Score 0 0 0  Altered sleeping 0 0 0  Tired, decreased energy 0 0 0  Change in appetite 0 0 0  Feeling bad or failure about yourself  0 0 0  Trouble concentrating 0 0 0  Moving slowly or fidgety/restless 0 0 0  Suicidal thoughts 0 0 0  PHQ-9 Score 0 0 0  Difficult doing work/chores Not difficult at all Not difficult at all Not difficult at all    BP Readings from Last 3 Encounters:  09/18/23 126/70  07/28/23 128/78  04/24/23 124/76    Physical Exam  Wt Readings from Last 3 Encounters:  09/18/23 189 lb (85.7 kg)  04/24/23 189 lb (85.7 kg)  02/23/23 188 lb (85.3 kg)    There were no vitals taken for this visit.  Assessment and Plan:  Problem List Items Addressed This Visit   None   No  follow-ups on file.    Reubin Milan, MD [facility] [place]

## 2023-10-23 ENCOUNTER — Ambulatory Visit: Payer: Self-pay | Admitting: Internal Medicine

## 2023-10-24 ENCOUNTER — Other Ambulatory Visit: Payer: Self-pay | Admitting: Internal Medicine

## 2023-10-24 DIAGNOSIS — R140 Abdominal distension (gaseous): Secondary | ICD-10-CM

## 2023-10-24 MED ORDER — DICYCLOMINE HCL 10 MG PO CAPS: 10.0000 mg | ORAL_CAPSULE | Freq: Three times a day (TID) | ORAL | 0 refills | Status: DC

## 2023-10-24 NOTE — Telephone Encounter (Signed)
 Pt response.  KP

## 2023-10-26 ENCOUNTER — Emergency Department
Admission: EM | Admit: 2023-10-26 | Discharge: 2023-10-27 | Disposition: A | Discharge status: Home / Self Care | Attending: Emergency Medicine | Admitting: Emergency Medicine

## 2023-10-26 ENCOUNTER — Other Ambulatory Visit: Payer: Self-pay

## 2023-10-26 ENCOUNTER — Emergency Department

## 2023-10-26 ENCOUNTER — Encounter: Payer: Self-pay | Admitting: *Deleted

## 2023-10-26 DIAGNOSIS — I10 Essential (primary) hypertension: Secondary | ICD-10-CM | POA: Diagnosis not present

## 2023-10-26 DIAGNOSIS — R110 Nausea: Secondary | ICD-10-CM | POA: Insufficient documentation

## 2023-10-26 DIAGNOSIS — K5732 Diverticulitis of large intestine without perforation or abscess without bleeding: Secondary | ICD-10-CM | POA: Diagnosis not present

## 2023-10-26 DIAGNOSIS — K760 Fatty (change of) liver, not elsewhere classified: Secondary | ICD-10-CM | POA: Diagnosis not present

## 2023-10-26 DIAGNOSIS — I700 Atherosclerosis of aorta: Secondary | ICD-10-CM | POA: Diagnosis not present

## 2023-10-26 DIAGNOSIS — N390 Urinary tract infection, site not specified: Secondary | ICD-10-CM | POA: Insufficient documentation

## 2023-10-26 DIAGNOSIS — K5792 Diverticulitis of intestine, part unspecified, without perforation or abscess without bleeding: Secondary | ICD-10-CM

## 2023-10-26 DIAGNOSIS — R102 Pelvic and perineal pain: Secondary | ICD-10-CM | POA: Diagnosis not present

## 2023-10-26 DIAGNOSIS — R1030 Lower abdominal pain, unspecified: Secondary | ICD-10-CM | POA: Diagnosis present

## 2023-10-26 LAB — CBC
HCT: 39.6 % (ref 36.0–46.0)
Hemoglobin: 13.2 g/dL (ref 12.0–15.0)
MCH: 29.5 pg (ref 26.0–34.0)
MCHC: 33.3 g/dL (ref 30.0–36.0)
MCV: 88.6 fL (ref 80.0–100.0)
Platelets: 268 10*3/uL (ref 150–400)
RBC: 4.47 MIL/uL (ref 3.87–5.11)
RDW: 11.7 % (ref 11.5–15.5)
WBC: 13 10*3/uL — ABNORMAL HIGH (ref 4.0–10.5)
nRBC: 0 % (ref 0.0–0.2)

## 2023-10-26 LAB — LIPASE, BLOOD: Lipase: 28 U/L (ref 11–51)

## 2023-10-26 LAB — COMPREHENSIVE METABOLIC PANEL
ALT: 22 U/L (ref 0–44)
AST: 21 U/L (ref 15–41)
Albumin: 4.1 g/dL (ref 3.5–5.0)
Alkaline Phosphatase: 68 U/L (ref 38–126)
Anion gap: 12 (ref 5–15)
BUN: 18 mg/dL (ref 8–23)
CO2: 23 mmol/L (ref 22–32)
Calcium: 9.2 mg/dL (ref 8.9–10.3)
Chloride: 105 mmol/L (ref 98–111)
Creatinine, Ser: 0.95 mg/dL (ref 0.44–1.00)
GFR, Estimated: >60 mL/min (ref >60)
Glucose, Bld: 133 mg/dL — ABNORMAL HIGH (ref 70–99)
Potassium: 4.4 mmol/L (ref 3.5–5.1)
Sodium: 140 mmol/L (ref 135–145)
Total Bilirubin: 0.6 mg/dL (ref 0.0–1.2)
Total Protein: 7.3 g/dL (ref 6.5–8.1)

## 2023-10-26 LAB — URINALYSIS, ROUTINE W REFLEX MICROSCOPIC
Bilirubin Urine: NEGATIVE
Glucose, UA: NEGATIVE mg/dL
Hgb urine dipstick: NEGATIVE
Ketones, ur: NEGATIVE mg/dL
Nitrite: NEGATIVE
Protein, ur: NEGATIVE mg/dL
Specific Gravity, Urine: 1.02 (ref 1.005–1.030)
pH: 5 (ref 5.0–8.0)

## 2023-10-26 LAB — RESP PANEL BY RT-PCR (RSV, FLU A&B, COVID)  RVPGX2
Influenza A by PCR: NEGATIVE
Influenza B by PCR: NEGATIVE
Resp Syncytial Virus by PCR: NEGATIVE
SARS Coronavirus 2 by RT PCR: NEGATIVE

## 2023-10-26 MED ORDER — IOHEXOL 300 MG/ML  SOLN
100.0000 mL | Freq: Once | INTRAMUSCULAR | Status: AC | PRN
  Administered 2023-10-26: 100 mL via INTRAVENOUS

## 2023-10-26 MED ORDER — METRONIDAZOLE 500 MG PO TABS: 500.0000 mg | ORAL_TABLET | Freq: Three times a day (TID) | ORAL | 0 refills | Status: AC

## 2023-10-26 MED ORDER — KETOROLAC TROMETHAMINE 15 MG/ML IJ SOLN
15.0000 mg | Freq: Once | INTRAMUSCULAR | Status: AC
  Administered 2023-10-26: 15 mg via INTRAVENOUS
  Filled 2023-10-26: qty 1

## 2023-10-26 MED ORDER — SODIUM CHLORIDE 0.9 % IV SOLN
1.0000 g | Freq: Once | INTRAVENOUS | Status: AC
  Administered 2023-10-26: 1 g via INTRAVENOUS
  Filled 2023-10-26: qty 10

## 2023-10-26 MED ORDER — CEFDINIR 300 MG PO CAPS: 300.0000 mg | ORAL_CAPSULE | Freq: Two times a day (BID) | ORAL | 0 refills | Status: DC

## 2023-10-26 MED ORDER — METRONIDAZOLE 500 MG/100ML IV SOLN
500.0000 mg | Freq: Once | INTRAVENOUS | Status: AC
  Administered 2023-10-26: 500 mg via INTRAVENOUS
  Filled 2023-10-26: qty 100

## 2023-10-26 MED ORDER — LACTATED RINGERS IV BOLUS: 1000.0000 mL | Freq: Once | INTRAVENOUS | Status: DC

## 2023-10-26 MED ORDER — CEFDINIR 300 MG PO CAPS
300.0000 mg | ORAL_CAPSULE | Freq: Two times a day (BID) | ORAL | 0 refills | Status: AC
Start: 2023-10-26 — End: 2023-11-05

## 2023-10-26 NOTE — Discharge Instructions (Addendum)
 Please take your antibiotics as prescribed for your diverticulitis as well as urinary tract infection.  Please follow-up with your primary care doctor for further management of your new abdominal symptoms and infections.  Please follow-up with gastroenterology for further management of abdominal symptoms, bloating, intermittent diarrhea and constipation.  Please return if you have persistent or worsening abdominal pain, fever, diarrhea, dehydration, vomiting.  Or if you have any additional concerns.

## 2023-10-26 NOTE — ED Provider Triage Note (Signed)
 Emergency Medicine Provider Triage Evaluation Note  Lisa Haas , a 68 y.o. female  was evaluated in triage.  Pt complains of lower abdominal pain x 5-6 weeks. She is nauseated and feels bloated. No fever.  Physical Exam  There were no vitals taken for this visit. Gen:   Awake, no distress   Resp:  Normal effort  MSK:   Moves extremities without difficulty  Other:    Medical Decision Making  Medically screening exam initiated at 4:18 PM.  Appropriate orders placed.  Lisa Haas was informed that the remainder of the evaluation will be completed by another provider, this initial triage assessment does not replace that evaluation, and the importance of remaining in the ED until their evaluation is complete.  Labs ordered.   Chinita Pester, FNP 10/26/23 1627

## 2023-10-26 NOTE — ED Provider Notes (Signed)
[facility] Provider Note    Event Date/Time   First MD Initiated Contact with Patient 10/26/23 2137     (approximate)   History   Abdominal Pain   HPI  Lisa Haas is a 68 y.o. female with history of hypertension, hyperlipidemia, GERD, presenting with lower abdominal pain.  Patient states that she has had crampy abdominal pain all over for the last 6 weeks, associated with bloating sensation, intermittently would have diarrhea and then constipation.  Was followed by primary care doctor who had referred her to multiple GI docs, she has not seen GI yet since many are not accepting new patients.  States that she has tried Gas-X, Bentyl, probiotics.  Started having more suprapubic abdominal pain last couple days.  She denies any dysuria or hematuria, does state that she has some urinary frequency.  No fever but she has chills.  No back pain.  No cough, vomiting.  Did have some nausea earlier but none now.  No prior abdominal surgeries.   On independent chart review, she was seen by primary care doctor in February, she had a colonoscopy in 2020 with 1 hyperplastic polyp, instructed her to start Colace and MiraLAX daily to help with bowel regimen.  Physical Exam   Triage Vital Signs: ED Triage Vitals  Encounter Vitals Group     BP 10/26/23 1618 (!) 130/114     Systolic BP Percentile --      Diastolic BP Percentile --      Pulse Rate 10/26/23 1618 100     Resp 10/26/23 1618 20     Temp 10/26/23 1618 99.1 F (37.3 C)     Temp Source 10/26/23 1618 Oral     SpO2 10/26/23 1618 97 %     Weight 10/26/23 1619 190 lb (86.2 kg)     Height 10/26/23 1619 5\' 7"  (1.702 m)     Head Circumference --      Peak Flow --      Pain Score 10/26/23 1618 7     Pain Loc --      Pain Education --      Exclude from Growth Chart --     Most recent vital signs: Vitals:   10/26/23 1618 10/26/23 2314  BP: (!) 130/114 135/62  Pulse: 100 85  Resp: 20 20  Temp: 99.1 F (37.3  C)   SpO2: 97% 100%     General: Awake, no distress.  CV:  Good peripheral perfusion.  Resp:  Normal effort.  Abd:  No distention.  Soft, tender to her lower abdomen, worse in the suprapubic region, no guarding  Other:  No CVA tenderness bilaterally   ED Results / Procedures / Treatments   Labs (all labs ordered are listed, but only abnormal results are displayed) Labs Reviewed  COMPREHENSIVE METABOLIC PANEL - Abnormal; Notable for the following components:      Result Value   Glucose, Bld 133 (*)    All other components within normal limits  CBC - Abnormal; Notable for the following components:   WBC 13.0 (*)    All other components within normal limits  URINALYSIS, ROUTINE W REFLEX MICROSCOPIC - Abnormal; Notable for the following components:   Color, Urine YELLOW (*)    APPearance CLOUDY (*)    Leukocytes,Ua LARGE (*)    Bacteria, UA RARE (*)    Non Squamous Epithelial PRESENT (*)    All other components within normal limits  RESP PANEL BY RT-PCR (  RSV, FLU A&B, COVID)  RVPGX2  LIPASE, BLOOD     RADIOLOGY CT abdomen pelvis on my interpretation without obvious free air   PROCEDURES:  Critical Care performed: No  Procedures   MEDICATIONS ORDERED IN ED: Medications  lactated ringers bolus 1,000 mL (has no administration in time range)  metroNIDAZOLE (FLAGYL) IVPB 500 mg (has no administration in time range)  cefTRIAXone (ROCEPHIN) 1 g in sodium chloride 0.9 % 100 mL IVPB (1 g Intravenous New Bag/Given 10/26/23 2235)  ketorolac (TORADOL) 15 MG/ML injection 15 mg (15 mg Intravenous Given 10/26/23 2231)  iohexol (OMNIPAQUE) 300 MG/ML solution 100 mL (100 mLs Intravenous Contrast Given 10/26/23 2258)     IMPRESSION / MDM / ASSESSMENT AND PLAN / ED COURSE  I reviewed the triage vital signs and the nursing notes.                              Differential diagnosis includes, but is not limited to, IBS, IBD, celiac disease, colitis, diverticulitis, UTI, viral  illness, gastroenteritis.  Considered SBO partial SBO.  Get labs, UA, CT abdomen pelvis, fluids, Toradol IV.  Patient's presentation is most consistent with acute presentation with potential threat to life or bodily function.  Dependent review of labs and imaging are below.  Patient does have a UTI, will give her a dose of IV ceftriaxone here.  No prior urine micros to compare.  Independent review of labs imaging are below.  Patient has uncomplicated diverticulitis.  Added IV Flagyl to her regimen.  Shared decision making done with patient and discussed imaging results as well as labs with patient, will give her 10-day course of cefdinir as well as Flagyl and have her follow-up with her primary care doctor for further management of her diverticulitis as well as a UTI.  Also gave her number to call for GI at [place], instructed to follow-up with them for her other GI symptoms.  Considered but no indication for inpatient admission at this time, she is safe for outpatient management.  Will discharge with strict precautions.  Patient is agreeable with the plan.  Clinical Course as of 10/26/23 2333  Thu Oct 26, 2023  2153 Independent review of labs, she has no leukocytosis, UA is consistent with UTI, electrolytes not severely deranged, LFTs are normal, lipase is normal. [TT]  2321 CT ABDOMEN PELVIS W CONTRAST IMPRESSION: 1. Marked severity sigmoid diverticulitis. 2. Hepatic steatosis. 3. Aortic atherosclerosis.   [TT]    Clinical Course User Index [TT] Claybon Jabs, MD     FINAL CLINICAL IMPRESSION(S) / ED DIAGNOSES   Final diagnoses:  Lower abdominal pain  Urinary tract infection without hematuria, site unspecified  Diverticulitis  Hepatic steatosis     Rx / DC Orders   ED Discharge Orders          Ordered    cefdinir (OMNICEF) 300 MG capsule  2 times daily,   Status:  Discontinued        10/26/23 2251    cefdinir (OMNICEF) 300 MG capsule  2 times daily        10/26/23 2322     metroNIDAZOLE (FLAGYL) 500 MG tablet  3 times daily        10/26/23 2322             Note:  This document was prepared using Dragon voice recognition software and may include unintentional dictation errors.    Claybon Jabs, MD  10/26/23 2333  

## 2023-10-26 NOTE — ED Triage Notes (Signed)
 Pt to triage via wheelchair.  Pt has low abd pain for 5-6 weeks.  Pt reports has nausea.  Pt reports loose stools.  No vaginal bleeding  pt denies urinary sx.  No back pain.  Pt alert speech clear.

## 2023-10-27 NOTE — ED Notes (Signed)
 LR not given due to incompatibility with rocephin and flagyl. Pt up for discharge before antibiotics completed and pt is able to drink and hold down fluids. Provider notified.

## 2023-10-27 NOTE — Telephone Encounter (Signed)
 The patient called and left a voicemail stating that she is trying to schedule an appointment for a follow-up after an ER visit overnight. She mentioned that if we cannot accommodate her, she will need to return to the doctor to seek assistance. She clarified that she was not trying to be pushy, but was simply trying to get the appointment scheduled.  I returned her call to confirm receipt of her message. I informed her that we currently do not have any available appointments for new patients. I offered to add her to our call-back list and assured her that we will contact her as soon as an appointment becomes available. I also informed her that she is scheduled with [facility] on 03/05/24 and advised her to contact [facility] directly to inquire about the possibility of an earlier appointment.

## 2023-10-30 NOTE — Telephone Encounter (Signed)
 FYI  KP

## 2023-11-08 ENCOUNTER — Ambulatory Visit (INDEPENDENT_AMBULATORY_CARE_PROVIDER_SITE_OTHER): Admitting: Internal Medicine

## 2023-11-08 ENCOUNTER — Encounter: Payer: Self-pay | Admitting: Internal Medicine

## 2023-11-08 VITALS — BP 136/70 | HR 94 | Ht 67.0 in | Wt 184.2 lb

## 2023-11-08 DIAGNOSIS — I10 Essential (primary) hypertension: Secondary | ICD-10-CM | POA: Diagnosis not present

## 2023-11-08 DIAGNOSIS — K5730 Diverticulosis of large intestine without perforation or abscess without bleeding: Secondary | ICD-10-CM | POA: Insufficient documentation

## 2023-11-08 DIAGNOSIS — K5732 Diverticulitis of large intestine without perforation or abscess without bleeding: Secondary | ICD-10-CM | POA: Diagnosis not present

## 2023-11-08 NOTE — Assessment & Plan Note (Signed)
 resolved after antibiotics recommend repeat Colonoscopy in 6 mo - can discuss with GI

## 2023-11-08 NOTE — Assessment & Plan Note (Signed)
 avoid large amounts of seeds and nuts in the diet Avoid constipation with fiber and hydration

## 2023-11-08 NOTE — Assessment & Plan Note (Signed)
 Blood pressure is well controlled.  Current medications amlodipine. Will continue same regimen along with efforts to limit dietary sodium.

## 2023-11-08 NOTE — Progress Notes (Signed)
 Date:  11/08/2023   Name:  Lisa Haas   DOB:  1955-09-27   MRN:  [id number]   Chief Complaint: Hospitalization Follow-up ER follow up. Treated for diverticulitis  10/26/23. With Cefdinir and Flagyl.  CT ABDOMEN PELVIS W CONTRAST IMPRESSION: 1. Marked severity sigmoid diverticulitis. 2. Hepatic steatosis. 3. Aortic atherosclerosis.   Abdominal Pain This is a new problem. The current episode started 1 to 4 weeks ago. The problem has been rapidly improving. The pain is located in the generalized abdominal region. The patient is experiencing no pain (now feels well with no symptoms). Pertinent negatives include no constipation, diarrhea, fever, headaches or vomiting.    Review of Systems  Constitutional:  Negative for chills, fatigue and fever.  Respiratory:  Negative for chest tightness, shortness of breath and wheezing.   Cardiovascular:  Negative for chest pain and palpitations.  Gastrointestinal:  Negative for abdominal pain, blood in stool, constipation, diarrhea and vomiting.  Neurological:  Negative for dizziness, light-headedness and headaches.  Psychiatric/Behavioral:  Negative for dysphoric mood and sleep disturbance. The patient is not nervous/anxious.      Lab Results  Component Value Date   NA 140 10/26/2023   K 4.4 10/26/2023   CO2 23 10/26/2023   GLUCOSE 133 (H) 10/26/2023   BUN 18 10/26/2023   CREATININE 0.95 10/26/2023   CALCIUM 9.2 10/26/2023   EGFR 88 04/24/2023   GFRNONAA >60 10/26/2023   Lab Results  Component Value Date   CHOL 233 (H) 04/24/2023   HDL 60 04/24/2023   LDLCALC 123 (H) 04/24/2023   TRIG 287 (H) 04/24/2023   CHOLHDL 3.9 04/24/2023   Lab Results  Component Value Date   TSH 3.000 04/24/2023   Lab Results  Component Value Date   HGBA1C 5.9 (H) 04/24/2023   Lab Results  Component Value Date   WBC 13.0 (H) 10/26/2023   HGB 13.2 10/26/2023   HCT 39.6 10/26/2023   MCV 88.6 10/26/2023   PLT 268 10/26/2023   Lab Results   Component Value Date   ALT 22 10/26/2023   AST 21 10/26/2023   ALKPHOS 68 10/26/2023   BILITOT 0.6 10/26/2023   Lab Results  Component Value Date   VD25OH 32.9 04/24/2023     Patient Active Problem List   Diagnosis Date Noted   Diverticulitis large intestine w/o perforation or abscess w/o bleeding 11/08/2023   Diverticulosis of sigmoid colon 11/08/2023   Prediabetes 04/24/2023   Primary osteoarthritis of both feet 11/17/2022   GERD without esophagitis 04/08/2019   Grade II hemorrhoids 10/03/2018   Bloodgood disease 06/22/2015   Dyslipidemia 06/22/2015   Essential (primary) hypertension 06/22/2015   History of adenomatous polyp of colon 06/22/2015   Hot flash, menopausal 06/22/2015   Heart palpitations 06/22/2015   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 Mohammed Kindle    Social History   Tobacco Use   Smoking status: Former    Current packs/day: 0.00    Types: Cigarettes    Quit date: 06/26/2009    Years since quitting: 14.3   Smokeless  tobacco: Never  Vaping Use   Vaping status: Never Used  Substance Use Topics   Alcohol use: Yes    Alcohol/week: 4.0 standard drinks of alcohol    Types: 4 Cans of beer per week    Comment: "2-6 beers per week"   Drug use: Yes    Types: Marijuana    Comment: "a couple of times per month"     Medication list has been reviewed and updated.  Current Meds  Medication Sig   amLODipine (NORVASC) 10 MG tablet Take 1 tablet (10 mg total) by mouth daily.   Aspirin-Acetaminophen-Caffeine (GOODYS EXTRA STRENGTH PO) Take 1 tablet by mouth daily.   atorvastatin (LIPITOR) 40 MG tablet Take 1 tablet  (40 mg total) by mouth daily.   omeprazole (PRILOSEC) 20 MG capsule Take 1 capsule (20 mg total) by mouth daily.       11/08/2023    2:01 PM 09/18/2023    1:41 PM 07/28/2023    1:42 PM 04/24/2023    8:01 AM  GAD 7 : Generalized Anxiety Score  Nervous, Anxious, on Edge 0 0 0 0  Control/stop worrying 0 0 0 0  Worry too much - different things 0 0 0 0  Trouble relaxing 0 0 0 0  Restless 0 0 0 0  Easily annoyed or irritable 0 0 0 0  Afraid - awful might happen 0 0 0 0  Total GAD 7 Score 0 0 0 0  Anxiety Difficulty Not difficult at all Not difficult at all Not difficult at all Not difficult at all       11/08/2023    2:01 PM 09/18/2023    1:40 PM 07/28/2023    1:42 PM  Depression screen PHQ 2/9  Decreased Interest 0 0 0  Down, Depressed, Hopeless 0 0 0  PHQ - 2 Score 0 0 0  Altered sleeping 0 0 0  Tired, decreased energy 0 0 0  Change in appetite 0 0 0  Feeling bad or failure about yourself  0 0 0  Trouble concentrating 0 0 0  Moving slowly or fidgety/restless 0 0 0  Suicidal thoughts 0 0 0  PHQ-9 Score 0 0 0  Difficult doing work/chores Not difficult at all Not difficult at all Not difficult at all    BP Readings from Last 3 Encounters:  11/08/23 136/70  10/27/23 133/62  09/18/23 126/70    Physical Exam Vitals and nursing note reviewed.  Constitutional:      General: She is not in acute distress.    Appearance: Normal appearance. She is well-developed.  HENT:     Head: Normocephalic and atraumatic.  Cardiovascular:     Rate and Rhythm: Normal rate and regular rhythm.  Pulmonary:     Effort: Pulmonary effort is normal. No respiratory distress.     Breath sounds: No wheezing or rhonchi.  Abdominal:     General: Abdomen is flat. There is no distension.     Palpations: Abdomen is soft. There is no mass.     Tenderness: There is no abdominal tenderness. There is no guarding or rebound.     Hernia: No hernia is present.  Musculoskeletal:     Cervical back: Normal  range of motion.     Right lower leg: No edema.     Left lower leg: No edema.  Skin:    General: Skin is warm and dry.     Findings: No rash.  Neurological:     Mental Status:  She is alert and oriented to person, place, and time.  Psychiatric:        Mood and Affect: Mood normal.        Behavior: Behavior normal.     Wt Readings from Last 3 Encounters:  11/08/23 184 lb 4 oz (83.6 kg)  10/26/23 190 lb (86.2 kg)  09/18/23 189 lb (85.7 kg)    BP 136/70   Pulse 94   Ht 5\' 7"  (1.702 m)   Wt 184 lb 4 oz (83.6 kg)   SpO2 96%   BMI 28.86 kg/m   Assessment and Plan:  Problem List Items Addressed This Visit       Unprioritized   Essential (primary) hypertension (Chronic)   Blood pressure is well controlled.  Current medications amlodipine. Will continue same regimen along with efforts to limit dietary sodium.       Diverticulosis of sigmoid colon (Chronic)   avoid large amounts of seeds and nuts in the diet Avoid constipation with fiber and hydration      Diverticulitis large intestine w/o perforation or abscess w/o bleeding - Primary   resolved after antibiotics recommend repeat Colonoscopy in 6 mo - can discuss with GI        No follow-ups on file.    Reubin Milan, MD [facility] [place]

## 2023-11-10 ENCOUNTER — Encounter: Payer: Self-pay | Admitting: Internal Medicine

## 2023-12-21 ENCOUNTER — Other Ambulatory Visit: Payer: Self-pay | Admitting: Internal Medicine

## 2023-12-21 ENCOUNTER — Ambulatory Visit (INDEPENDENT_AMBULATORY_CARE_PROVIDER_SITE_OTHER): Admitting: Internal Medicine

## 2023-12-21 ENCOUNTER — Encounter: Payer: Self-pay | Admitting: Internal Medicine

## 2023-12-21 VITALS — BP 134/74 | HR 92 | Ht 67.0 in | Wt 181.1 lb

## 2023-12-21 DIAGNOSIS — R399 Unspecified symptoms and signs involving the genitourinary system: Secondary | ICD-10-CM

## 2023-12-21 DIAGNOSIS — K5732 Diverticulitis of large intestine without perforation or abscess without bleeding: Secondary | ICD-10-CM | POA: Diagnosis not present

## 2023-12-21 LAB — POCT URINALYSIS DIPSTICK
Bilirubin, UA: NEGATIVE
Blood, UA: NEGATIVE
Glucose, UA: NEGATIVE
Ketones, UA: NEGATIVE
Nitrite, UA: NEGATIVE
Protein, UA: NEGATIVE
Spec Grav, UA: 1.01 (ref 1.010–1.025)
Urobilinogen, UA: 0.2 U/dL
pH, UA: 6.5 (ref 5.0–8.0)

## 2023-12-21 MED ORDER — METRONIDAZOLE 500 MG PO TABS: 500.0000 mg | ORAL_TABLET | Freq: Two times a day (BID) | ORAL | 0 refills | Status: AC

## 2023-12-21 MED ORDER — CEFDINIR 300 MG PO CAPS: 300.0000 mg | ORAL_CAPSULE | Freq: Two times a day (BID) | ORAL | 0 refills | Status: AC

## 2023-12-21 NOTE — Assessment & Plan Note (Signed)
 concern for persistent infection/microabscesses will get CBC treat with Flagyl  and Cefdinir  - consider repeat CT if no resolution of symptoms or if WBC high.

## 2023-12-21 NOTE — Progress Notes (Signed)
 Date:  12/21/2023   Name:  Lisa Haas   DOB:  Aug 09, 1955   MRN:  [id number]   Chief Complaint: Urinary Tract Infection (Patient said she was seen at the hospital and does not think it cleared up)  Urinary Tract Infection  This is a new problem. Episode onset: past three days - urine odor and dark yellow. The patient is experiencing no pain. There has been no fever. Pertinent negatives include no chills, frequency, nausea, urgency or vomiting.  Abdominal Pain This is a recurrent problem. The problem has been waxing and waning. The pain is located in the LLQ. The pain is moderate. The quality of the pain is colicky and cramping. The abdominal pain does not radiate. Pertinent negatives include no constipation, diarrhea, dysuria, fever, frequency, headaches, nausea or vomiting.  Very similar pain to the diverticulitis she had in March.  She has has several bouts of moderate to severe LLQ pain last 2-3 days since March.  No fever or chills, no diarrhea or blood in stools.   10/08/23 [id number] is within normal limits. Appendix appears normal. No evidence of bowel dilatation. A markedly inflamed diverticula are seen within the mid sigmoid colon. There is no evidence of associated perforation or abscess.  Review of Systems  Constitutional:  Negative for chills, fatigue and fever.  Respiratory:  Negative for chest tightness and shortness of breath.   Gastrointestinal:  Positive for abdominal pain. Negative for constipation, diarrhea, nausea and vomiting.  Genitourinary:  Negative for dysuria, frequency and urgency.  Neurological:  Negative for dizziness and headaches.     Lab Results  Component Value Date   NA 140 10/26/2023   K 4.4 10/26/2023   CO2 23 10/26/2023   GLUCOSE 133 (H) 10/26/2023   BUN 18 10/26/2023   CREATININE 0.95 10/26/2023   CALCIUM  9.2 10/26/2023   EGFR 88 04/24/2023   GFRNONAA >60 10/26/2023   Lab Results  Component Value Date   CHOL 233 (H) 04/24/2023   HDL  60 04/24/2023   LDLCALC 123 (H) 04/24/2023   TRIG 287 (H) 04/24/2023   CHOLHDL 3.9 04/24/2023   Lab Results  Component Value Date   TSH 3.000 04/24/2023   Lab Results  Component Value Date   HGBA1C 5.9 (H) 04/24/2023   Lab Results  Component Value Date   WBC 13.0 (H) 10/26/2023   HGB 13.2 10/26/2023   HCT 39.6 10/26/2023   MCV 88.6 10/26/2023   PLT 268 10/26/2023   Lab Results  Component Value Date   ALT 22 10/26/2023   AST 21 10/26/2023   ALKPHOS 68 10/26/2023   BILITOT 0.6 10/26/2023   Lab Results  Component Value Date   VD25OH 32.9 04/24/2023     Patient Active Problem List   Diagnosis Date Noted   Diverticulitis large intestine w/o perforation or abscess w/o bleeding 11/08/2023   Diverticulosis of sigmoid colon 11/08/2023   Prediabetes 04/24/2023   Primary osteoarthritis of both feet 11/17/2022   GERD without esophagitis 04/08/2019   Grade II hemorrhoids 10/03/2018   Bloodgood disease 06/22/2015   Dyslipidemia 06/22/2015   Essential (primary) hypertension 06/22/2015   History of adenomatous polyp of colon 06/22/2015   Hot flash, menopausal 06/22/2015   Heart palpitations 06/22/2015   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 Maeola Schmidt    Social History   Tobacco Use   Smoking status: Former    Current packs/day: 0.00    Types: Cigarettes    Quit date: 06/26/2009    Years since quitting: 14.4   Smokeless tobacco: Never  Vaping Use   Vaping status: Never Used  Substance Use Topics   Alcohol use: Yes    Alcohol/week: 4.0 standard drinks of alcohol    Types: 4 Cans of beer per week    Comment: "2-6 beers per  week"   Drug use: Yes    Types: Marijuana    Comment: "a couple of times per month"     Medication list has been reviewed and updated.  Current Meds  Medication Sig   amLODipine  (NORVASC ) 10 MG tablet Take 1 tablet (10 mg total) by mouth daily.   Aspirin-Acetaminophen-Caffeine (GOODYS EXTRA STRENGTH PO) Take 1 tablet by mouth daily.   atorvastatin  (LIPITOR) 40 MG tablet Take 1 tablet (40 mg total) by mouth daily.   cefdinir  (OMNICEF ) 300 MG capsule Take 1 capsule (300 mg total) by mouth 2 (two) times daily for 10 days.   metroNIDAZOLE  (FLAGYL ) 500 MG tablet Take 1 tablet (500 mg total) by mouth 2 (two) times daily for 10 days.   omeprazole  (PRILOSEC) 20 MG capsule Take 1 capsule (20 mg total) by mouth daily.       12/21/2023   10:24 AM 11/08/2023    2:01 PM 09/18/2023    1:41 PM 07/28/2023    1:42 PM  GAD 7 : Generalized Anxiety Score  Nervous, Anxious, on Edge 0 0 0 0  Control/stop worrying 0 0 0 0  Worry too much - different things 0 0 0 0  Trouble relaxing 0 0 0 0  Restless 0 0 0 0  Easily annoyed or irritable 0 0 0 0  Afraid - awful might happen 0 0 0 0  Total GAD 7 Score 0 0 0 0  Anxiety Difficulty Not difficult at all Not difficult at all Not difficult at all Not difficult at all       12/21/2023   10:24 AM 11/08/2023    2:01 PM 09/18/2023    1:40 PM  Depression screen PHQ 2/9  Decreased Interest 0 0 0  Down, Depressed, Hopeless 0 0 0  PHQ - 2 Score 0 0 0  Altered sleeping 0 0 0  Tired, decreased energy 0 0 0  Change in appetite 0 0 0  Feeling bad or failure about yourself  0 0 0  Trouble concentrating 0 0 0  Moving slowly or fidgety/restless 0 0 0  Suicidal thoughts 0 0 0  PHQ-9 Score 0 0 0  Difficult doing work/chores Not difficult at all Not difficult at all Not difficult at all    BP Readings from Last 3 Encounters:  12/21/23 134/74  11/08/23 136/70  10/27/23 133/62    Physical Exam Vitals and nursing note reviewed.  Constitutional:      General:  She is not in acute distress.    Appearance: Normal appearance. She is well-developed.  HENT:     Head: Normocephalic and atraumatic.  Cardiovascular:     Rate and Rhythm: Normal rate and regular rhythm.     Heart sounds: No  murmur heard. Pulmonary:     Effort: Pulmonary effort is normal. No respiratory distress.     Breath sounds: No wheezing or rhonchi.  Abdominal:     General: Abdomen is flat.     Palpations: Abdomen is soft.     Tenderness: There is abdominal tenderness in the left lower quadrant. There is no right CVA tenderness, left CVA tenderness, guarding or rebound.  Musculoskeletal:     Cervical back: Normal range of motion.  Skin:    General: Skin is warm and dry.     Findings: No rash.  Neurological:     Mental Status: She is alert and oriented to person, place, and time.  Psychiatric:        Mood and Affect: Mood normal.        Behavior: Behavior normal.    Lab Results  Component Value Date   COLORU amber 12/21/2023   CLARITYU cloudy 12/21/2023   GLUCOSEUR Negative 12/21/2023   BILIRUBINUR negative 12/21/2023   KETONESU negative 12/21/2023   SPECGRAV 1.010 12/21/2023   RBCUR negative 12/21/2023   PHUR 6.5 12/21/2023   PROTEINUR Negative 12/21/2023   UROBILINOGEN 0.2 12/21/2023   LEUKOCYTESUR Large (3+) (A) 12/21/2023     Wt Readings from Last 3 Encounters:  12/21/23 181 lb 2 oz (82.2 kg)  11/08/23 184 lb 4 oz (83.6 kg)  10/26/23 190 lb (86.2 kg)    BP 134/74   Pulse 92   Ht 5\' 7"  (1.702 m)   Wt 181 lb 2 oz (82.2 kg)   SpO2 99%   BMI 28.37 kg/m   Assessment and Plan:  Problem List Items Addressed This Visit       Unprioritized   Diverticulitis large intestine w/o perforation or abscess w/o bleeding   concern for persistent infection/microabscesses will get CBC treat with Flagyl  and Cefdinir  - consider repeat CT if no resolution of symptoms or if WBC high.       Relevant Medications   cefdinir  (OMNICEF ) 300 MG capsule   metroNIDAZOLE   (FLAGYL ) 500 MG tablet   Other Relevant Orders   CBC with Differential/Platelet   Other Visit Diagnoses       UTI symptoms    -  Primary   will get UCx adjust treatment if needed push fluids   Relevant Orders   POCT urinalysis dipstick (Completed)   Urine Culture       No follow-ups on file.    Sheron Dixons, MD [facility] [place]

## 2023-12-22 ENCOUNTER — Ambulatory Visit: Payer: Self-pay | Admitting: Internal Medicine

## 2023-12-22 ENCOUNTER — Encounter: Payer: Self-pay | Admitting: Internal Medicine

## 2023-12-22 LAB — CBC WITH DIFFERENTIAL/PLATELET
Basophils Absolute: 0.1 10*3/uL (ref 0.0–0.2)
Basos: 1 %
EOS (ABSOLUTE): 0.1 10*3/uL (ref 0.0–0.4)
Eos: 1 %
Hematocrit: 42 % (ref 34.0–46.6)
Hemoglobin: 13.6 g/dL (ref 11.1–15.9)
Immature Grans (Abs): 0 10*3/uL (ref 0.0–0.1)
Immature Granulocytes: 0 %
Lymphocytes Absolute: 1.9 10*3/uL (ref 0.7–3.1)
Lymphs: 20 %
MCH: 28.9 pg (ref 26.6–33.0)
MCHC: 32.4 g/dL (ref 31.5–35.7)
MCV: 89 fL (ref 79–97)
Monocytes Absolute: 0.8 10*3/uL (ref 0.1–0.9)
Monocytes: 8 %
Neutrophils Absolute: 6.8 10*3/uL (ref 1.4–7.0)
Neutrophils: 70 %
Platelets: 252 10*3/uL (ref 150–450)
RBC: 4.71 x10E6/uL (ref 3.77–5.28)
RDW: 13 % (ref 11.7–15.4)
WBC: 9.7 10*3/uL (ref 3.4–10.8)

## 2023-12-22 NOTE — Telephone Encounter (Signed)
Pleas review.

## 2023-12-26 ENCOUNTER — Ambulatory Visit: Payer: Self-pay | Admitting: Internal Medicine

## 2023-12-26 DIAGNOSIS — B952 Enterococcus as the cause of diseases classified elsewhere: Secondary | ICD-10-CM

## 2023-12-26 LAB — URINE CULTURE

## 2023-12-26 MED ORDER — NITROFURANTOIN MONOHYD MACRO 100 MG PO CAPS
100.0000 mg | ORAL_CAPSULE | Freq: Two times a day (BID) | ORAL | 0 refills | Status: AC
Start: 2023-12-26 — End: 2024-01-02

## 2023-12-26 NOTE — Telephone Encounter (Signed)
 Please review.  KP

## 2024-01-04 ENCOUNTER — Encounter: Payer: Self-pay | Admitting: Internal Medicine

## 2024-01-20 ENCOUNTER — Other Ambulatory Visit: Payer: Self-pay | Admitting: Internal Medicine

## 2024-01-20 DIAGNOSIS — E785 Hyperlipidemia, unspecified: Secondary | ICD-10-CM

## 2024-01-23 NOTE — Telephone Encounter (Signed)
 Requested Prescriptions  Pending Prescriptions Disp Refills   atorvastatin  (LIPITOR) 40 MG tablet [Pharmacy Med Name: ATORVASTATIN  CALCIUM  40 MG TAB] 90 tablet 3    Sig: TAKE 1 TABLET BY MOUTH ONCE EVERY EVENING     Cardiovascular:  Antilipid - Statins Failed - 01/23/2024  9:03 AM      Failed - Lipid Panel in normal range within the last 12 months    Cholesterol, Total  Date Value Ref Range Status  04/24/2023 233 (H) 100 - 199 mg/dL Final   LDL Chol Calc (NIH)  Date Value Ref Range Status  04/24/2023 123 (H) 0 - 99 mg/dL Final   HDL  Date Value Ref Range Status  04/24/2023 60 >39 mg/dL Final   Triglycerides  Date Value Ref Range Status  04/24/2023 287 (H) 0 - 149 mg/dL Final         Passed - Patient is not pregnant      Passed - Valid encounter within last 12 months    Recent Outpatient Visits           1 month ago UTI symptoms   [place] [facility] at [facility], Chales [place], MD   2 months ago Diverticulitis large intestine w/o perforation or abscess w/o bleeding   [place] [facility] at [facility], Chales [place], MD   4 months ago Change in bowel habits   [place] [facility] at [facility], Chales [place], MD       Future Appointments             In 3 months Gala Jubilee, Chales [place], MD [facility] at [facility]

## 2024-03-08 ENCOUNTER — Encounter: Payer: Self-pay | Admitting: Gastroenterology

## 2024-03-08 NOTE — Anesthesia Preprocedure Evaluation (Signed)
Anesthesia Evaluation    Airway        Dental   Pulmonary former smoker          Cardiovascular hypertension,      Neuro/Psych    GI/Hepatic   Endo/Other    Renal/GU      Musculoskeletal   Abdominal   Peds  Hematology   Anesthesia Other Findings   Reproductive/Obstetrics                              Anesthesia Physical Anesthesia Plan Anesthesia Quick Evaluation  

## 2024-03-18 ENCOUNTER — Encounter: Payer: Self-pay | Admitting: Internal Medicine

## 2024-03-18 ENCOUNTER — Ambulatory Visit (INDEPENDENT_AMBULATORY_CARE_PROVIDER_SITE_OTHER): Admitting: Internal Medicine

## 2024-03-18 VITALS — BP 128/70 | HR 99 | Temp 98.1°F | Ht 67.0 in | Wt 183.0 lb

## 2024-03-18 DIAGNOSIS — J0100 Acute maxillary sinusitis, unspecified: Secondary | ICD-10-CM

## 2024-03-18 MED ORDER — AZITHROMYCIN 250 MG PO TABS: ORAL_TABLET | ORAL | 0 refills | Status: AC

## 2024-03-18 NOTE — Progress Notes (Signed)
 Date:  03/18/2024   Name:  Lisa Haas   DOB:  11/30/1955   MRN:  [id number]   Chief Complaint: Cough (Cough, congestion- clear x 5 day. Hearing crackling in ear when blowing nose. No body ache, no fever, no sob.)  Cough Associated symptoms include postnasal drip. Pertinent negatives include no chest pain, fever, shortness of breath or wheezing.    Review of Systems  Constitutional:  Negative for appetite change, diaphoresis and fever.  HENT:  Positive for congestion, hearing loss, postnasal drip and sinus pressure.   Respiratory:  Positive for cough. Negative for shortness of breath and wheezing.   Cardiovascular:  Negative for chest pain and palpitations.     Lab Results  Component Value Date   NA 140 10/26/2023   K 4.4 10/26/2023   CO2 23 10/26/2023   GLUCOSE 133 (H) 10/26/2023   BUN 18 10/26/2023   CREATININE 0.95 10/26/2023   CALCIUM  9.2 10/26/2023   EGFR 88 04/24/2023   GFRNONAA >60 10/26/2023   Lab Results  Component Value Date   CHOL 233 (H) 04/24/2023   HDL 60 04/24/2023   LDLCALC 123 (H) 04/24/2023   TRIG 287 (H) 04/24/2023   CHOLHDL 3.9 04/24/2023   Lab Results  Component Value Date   TSH 3.000 04/24/2023   Lab Results  Component Value Date   HGBA1C 5.9 (H) 04/24/2023   Lab Results  Component Value Date   WBC 9.7 12/21/2023   HGB 13.6 12/21/2023   HCT 42.0 12/21/2023   MCV 89 12/21/2023   PLT 252 12/21/2023   Lab Results  Component Value Date   ALT 22 10/26/2023   AST 21 10/26/2023   ALKPHOS 68 10/26/2023   BILITOT 0.6 10/26/2023   Lab Results  Component Value Date   VD25OH 32.9 04/24/2023     Patient Active Problem List   Diagnosis Date Noted   Diverticulitis large intestine w/o perforation or abscess w/o bleeding 11/08/2023   Diverticulosis of sigmoid colon 11/08/2023   Prediabetes 04/24/2023   Primary osteoarthritis of both feet 11/17/2022   GERD without esophagitis 04/08/2019   Grade II hemorrhoids 10/03/2018   Bloodgood  disease 06/22/2015   Dyslipidemia 06/22/2015   Essential (primary) hypertension 06/22/2015   History of adenomatous polyp of colon 06/22/2015   Hot flash, menopausal 06/22/2015   Heart palpitations 06/22/2015   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 Hulen    Social History   Tobacco Use   Smoking status: Former    Current packs/day: 0.00    Types: Cigarettes    Quit date: 06/26/2009    Years since quitting: 14.7   Smokeless tobacco: Never  Vaping Use   Vaping status: Never Used  Substance Use Topics   Alcohol use: Yes    Alcohol/week: 4.0 standard drinks of alcohol    Types: 4 Cans of beer per week    Comment: 2-6 beers per week   Drug use: Yes    Types: Marijuana    Comment: a couple of times per month  Medication list has been reviewed and updated.  Current Meds  Medication Sig   amLODipine  (NORVASC ) 10 MG tablet Take 1 tablet (10 mg total) by mouth daily.   Aspirin-Acetaminophen-Caffeine (GOODYS EXTRA STRENGTH PO) Take 1 tablet by mouth daily.   atorvastatin  (LIPITOR) 40 MG tablet TAKE 1 TABLET BY MOUTH ONCE EVERY EVENING   azithromycin  (ZITHROMAX  Z-PAK) 250 MG tablet UAD   omeprazole  (PRILOSEC) 20 MG capsule Take 1 capsule (20 mg total) by mouth daily.       03/18/2024    1:25 PM 12/21/2023   10:24 AM 11/08/2023    2:01 PM 09/18/2023    1:41 PM  GAD 7 : Generalized Anxiety Score  Nervous, Anxious, on Edge 0 0 0 0  Control/stop worrying 0 0 0 0  Worry too much - different things 0 0 0 0  Trouble relaxing 0 0 0 0  Restless 0 0 0 0  Easily annoyed or irritable 0 0 0 0  Afraid - awful might happen 0 0 0 0   Total GAD 7 Score 0 0 0 0  Anxiety Difficulty Not difficult at all Not difficult at all Not difficult at all Not difficult at all       03/18/2024    1:25 PM 12/21/2023   10:24 AM 11/08/2023    2:01 PM  Depression screen PHQ 2/9  Decreased Interest 0 0 0  Down, Depressed, Hopeless 0 0 0  PHQ - 2 Score 0 0 0  Altered sleeping 0 0 0  Tired, decreased energy 0 0 0  Change in appetite 0 0 0  Feeling bad or failure about yourself  0 0 0  Trouble concentrating 0 0 0  Moving slowly or fidgety/restless 0 0 0  Suicidal thoughts 0 0 0  PHQ-9 Score 0 0 0  Difficult doing work/chores Not difficult at all Not difficult at all Not difficult at all    BP Readings from Last 3 Encounters:  03/18/24 128/70  12/21/23 134/74  11/08/23 136/70    Physical Exam Constitutional:      Appearance: She is well-developed.  HENT:     Right Ear: Ear canal and external ear normal. Tympanic membrane is retracted. Tympanic membrane is not erythematous.     Left Ear: Ear canal and external ear normal. Tympanic membrane is retracted. Tympanic membrane is not erythematous.     Nose:     Right Sinus: Maxillary sinus tenderness present. No frontal sinus tenderness.     Left Sinus: Maxillary sinus tenderness present. No frontal sinus tenderness.     Mouth/Throat:     Mouth: No oral lesions.     Pharynx: Uvula midline. No oropharyngeal exudate or posterior oropharyngeal erythema.  Cardiovascular:     Rate and Rhythm: Normal rate and regular rhythm.     Heart sounds: Normal heart sounds.  Pulmonary:     Effort: Pulmonary effort is normal.     Breath sounds: Normal breath sounds. No decreased breath sounds, wheezing or rales.  Lymphadenopathy:     Cervical: No cervical adenopathy.  Neurological:     Mental Status: She is alert and oriented to person, place, and time.     Wt Readings from Last 3 Encounters:  03/18/24 183 lb (83 kg)  12/21/23 181 lb 2 oz (82.2 kg)  11/08/23 184 lb 4 oz (83.6 kg)    BP  128/70   Pulse 99   Temp 98.1 F (36.7 C) (Oral)   Ht 5' 7 (1.702 m)  Wt 183 lb (83 kg)   SpO2 98%   BMI 28.66 kg/m   Assessment and Plan:  Problem List Items Addressed This Visit   None Visit Diagnoses       Acute non-recurrent maxillary sinusitis    -  Primary   continue Mucinex and fluids consider adding steroid nasal spray if ear fullness continues   Relevant Medications   azithromycin  (ZITHROMAX  Z-PAK) 250 MG tablet       No follow-ups on file.    Leita HILARIO Adie, MD [facility] [place]

## 2024-03-19 HISTORY — DX: Gastro-esophageal reflux disease without esophagitis: K21.9

## 2024-03-19 HISTORY — DX: Unspecified osteoarthritis, unspecified site: M19.90

## 2024-03-19 HISTORY — DX: Prediabetes: R73.03

## 2024-04-02 ENCOUNTER — Encounter: Payer: Self-pay | Admitting: Anesthesiology

## 2024-04-02 ENCOUNTER — Ambulatory Visit: Admission: RE | Admit: 2024-04-02 | Source: Home / Self Care | Admitting: Gastroenterology

## 2024-04-02 SURGERY — COLONOSCOPY
Anesthesia: General

## 2024-04-24 ENCOUNTER — Ambulatory Visit (INDEPENDENT_AMBULATORY_CARE_PROVIDER_SITE_OTHER): Admitting: Emergency Medicine

## 2024-04-24 VITALS — Ht 67.5 in | Wt 184.0 lb

## 2024-04-24 DIAGNOSIS — Z0000 Encounter for general adult medical examination without abnormal findings: Secondary | ICD-10-CM | POA: Diagnosis not present

## 2024-04-24 DIAGNOSIS — Z1231 Encounter for screening mammogram for malignant neoplasm of breast: Secondary | ICD-10-CM

## 2024-04-24 NOTE — Patient Instructions (Signed)
 Lisa Haas,  Thank you for taking the time for your Medicare Wellness Visit. I appreciate your continued commitment to your health goals. Please review the care plan we discussed, and feel free to reach out if I can assist you further.  Medicare recommends these wellness visits once per year to help you and your care team stay ahead of potential health issues. These visits are designed to focus on prevention, allowing your provider to concentrate on managing your acute and chronic conditions during your regular appointments.  Please note that Annual Wellness Visits do not include a physical exam. Some assessments may be limited, especially if the visit was conducted virtually. If needed, we may recommend a separate in-person follow-up with your provider.  Ongoing Care Seeing your primary care provider every 3 to 6 months helps us  monitor your health and provide consistent, personalized care.   Referrals If a referral was made during today's visit and you haven't received any updates within two weeks, please contact the referred provider directly to check on the status.  Recommended Screenings:  Get the flu vaccine at your OV on 04/25/24. Discuss with PCP about a referral for a colonoscopy.  Please call to schedule your mammogram (due 07/13/24):  [facility] Address: [address], [place], [place] Phone: [phone]  [facility] [address], [address] [place], [place] [address] Phone: [phone]    Health Maintenance  Topic Date Due   DTaP/Tdap/Td vaccine (1 - Tdap) Never done   Zoster (Shingles) Vaccine (1 of 2) 03/25/1977   Screening for Lung Cancer  Never done   COVID-19 Vaccine (3 - Moderna risk series) 12/11/2019   DEXA scan (bone density measurement)  Never done   Colon Cancer Screening  10/16/2023   Flu Shot  03/08/2024   Breast Cancer Screening  07/12/2024   Medicare Annual Wellness Visit   04/24/2025   Pneumococcal Vaccine for age over 72  Completed   Hepatitis C Screening  Addressed   HPV Vaccine  Aged Out   Meningitis B Vaccine  Aged Out       04/24/2024    9:37 AM  Advanced Directives  Does Patient Have a Medical Advance Directive? Yes  Type of Estate agent of [place];Living will  Does patient want to make changes to medical advance directive? No - Patient declined  Copy of Healthcare Power of Attorney in Chart? No - copy requested   Advance Care Planning is important because it: Ensures you receive medical care that aligns with your values, goals, and preferences. Provides guidance to your family and loved ones, reducing the emotional burden of decision-making during critical moments.  Vision: Annual vision screenings are recommended for early detection of glaucoma, cataracts, and diabetic retinopathy. These exams can also reveal signs of chronic conditions such as diabetes and high blood pressure.  Dental: Annual dental screenings help detect early signs of oral cancer, gum disease, and other conditions linked to overall health, including heart disease and diabetes.  Please see the attached documents for additional preventive care recommendations.    Fall Prevention in the Home, Adult Falls can cause injuries and affect people of all ages. There are many simple things that you can do to make your home safe and to help prevent falls. If you need it, ask for help making these changes. What actions can I take to prevent falls? General information Use good lighting in all rooms. Make sure to: Replace  any light bulbs that burn out. Turn on lights if it is dark and use night-lights. Keep items that you use often in easy-to-reach places. Lower the shelves around your home if needed. Move furniture so that there are clear paths around it. Do not keep throw rugs or other things on the floor that can make you trip. If any of your floors are  uneven, fix them. Add color or contrast paint or tape to clearly mark and help you see: Grab bars or handrails. First and last steps of staircases. Where the edge of each step is. If you use a ladder or stepladder: Make sure that it is fully opened. Do not climb a closed ladder. Make sure the sides of the ladder are locked in place. Have someone hold the ladder while you use it. Know where your pets are as you move through your home. What can I do in the bathroom?     Keep the floor dry. Clean up any water that is on the floor right away. Remove soap buildup in the bathtub or shower. Buildup makes bathtubs and showers slippery. Use non-skid mats or decals on the floor of the bathtub or shower. Attach bath mats securely with double-sided, non-slip rug tape. If you need to sit down while you are in the shower, use a non-slip stool. Install grab bars by the toilet and in the bathtub and shower. Do not use towel bars as grab bars. What can I do in the bedroom? Make sure that you have a light by your bed that is easy to reach. Do not use any sheets or blankets on your bed that hang to the floor. Have a firm bench or chair with side arms that you can use for support when you get dressed. What can I do in the kitchen? Clean up any spills right away. If you need to reach something above you, use a sturdy step stool that has a grab bar. Keep electrical cables out of the way. Do not use floor polish or wax that makes floors slippery. What can I do with my stairs? Do not leave anything on the stairs. Make sure that you have a light switch at the top and the bottom of the stairs. Have them installed if you do not have them. Make sure that there are handrails on both sides of the stairs. Fix handrails that are broken or loose. Make sure that handrails are as long as the staircases. Install non-slip stair treads on all stairs in your home if they do not have carpet. Avoid having throw rugs at the  top or bottom of stairs, or secure the rugs with carpet tape to prevent them from moving. Choose a carpet design that does not hide the edge of steps on the stairs. Make sure that carpet is firmly attached to the stairs. Fix any carpet that is loose or worn. What can I do on the outside of my home? Use bright outdoor lighting. Repair the edges of walkways and driveways and fix any cracks. Clear paths of anything that can make you trip, such as tools or rocks. Add color or contrast paint or tape to clearly mark and help you see high doorway thresholds. Trim any bushes or trees on the main path into your home. Check that handrails are securely fastened and in good repair. Both sides of all steps should have handrails. Install guardrails along the edges of any raised decks or porches. Have leaves, snow, and ice cleared regularly.  Use sand, salt, or ice melt on walkways during winter months if you live where there is ice and snow. In the garage, clean up any spills right away, including grease or oil spills. What other actions can I take? Review your medicines with your health care provider. Some medicines can make you confused or feel dizzy. This can increase your chance of falling. Wear closed-toe shoes that fit well and support your feet. Wear shoes that have rubber soles and low heels. Use a cane, walker, scooter, or crutches that help you move around if needed. Talk with your provider about other ways that you can decrease your risk of falls. This may include seeing a physical therapist to learn to do exercises to improve movement and strength. Where to find more information Centers for Disease Control and Prevention, [employer]: TonerPromos.no [employer] on Aging: BaseRingTones.pl National Institute on Aging: BaseRingTones.pl Contact a health care provider if: You are afraid of falling at home. You feel weak, drowsy, or dizzy at home. You fall at home. Get help right away if you: Lose consciousness or  have trouble moving after a fall. Have a fall that causes a head injury. These symptoms may be an emergency. Get help right away. Call 911. Do not wait to see if the symptoms will go away. Do not drive yourself to the hospital. This information is not intended to replace advice given to you by your health care provider. Make sure you discuss any questions you have with your health care provider. Document Revised: 03/28/2022 Document Reviewed: 03/28/2022 Elsevier Patient Education  2024 [employer].

## 2024-04-24 NOTE — Progress Notes (Signed)
 Subjective:   Lisa Haas is a 68 y.o. who presents for a Medicare Wellness preventive visit.  As a reminder, Annual Wellness Visits don't include a physical exam, and some assessments may be limited, especially if this visit is performed virtually. We may recommend an in-person follow-up visit with your provider if needed.  Visit Complete: Virtual I connected with  Lisa Haas on 04/24/24 by a audio enabled telemedicine application and verified that I am speaking with the correct person using two identifiers.  Patient Location: Home  Provider Location: Home Office  I discussed the limitations of evaluation and management by telemedicine. The patient expressed understanding and agreed to proceed.  Vital Signs: Because this visit was a virtual/telehealth visit, some criteria may be missing or patient reported. Any vitals not documented were not able to be obtained and vitals that have been documented are patient reported.  VideoDeclined- This patient declined Librarian, academic. Therefore the visit was completed with audio only.  Persons Participating in Visit: Patient.  AWV Questionnaire: No: Patient Medicare AWV questionnaire was not completed prior to this visit.  Cardiac Risk Factors include: advanced age (>36men, >54 women);dyslipidemia;hypertension;Other (see comment), Risk factor comments: prediabetic     Objective:    Today's Vitals   04/24/24 0923  Weight: 184 lb (83.5 kg)  Height: 5' 7.5 (1.715 m)   Body mass index is 28.39 kg/m.     04/24/2024    9:37 AM 10/26/2023    4:19 PM 08/08/2019    9:14 AM 03/23/2016    8:37 AM  Advanced Directives  Does Patient Have a Medical Advance Directive? Yes No No Yes   Type of Estate agent of [place];Living will     Does patient want to make changes to medical advance directive? No - Patient declined     Copy of Healthcare Power of Attorney in Chart? No - copy requested         Data saved with a previous flowsheet row definition    Current Medications (verified) Outpatient Encounter Medications as of 04/24/2024  Medication Sig   amLODipine  (NORVASC ) 10 MG tablet Take 1 tablet (10 mg total) by mouth daily.   Aspirin-Acetaminophen-Caffeine (GOODYS EXTRA STRENGTH PO) Take 1 tablet by mouth daily.   atorvastatin  (LIPITOR) 40 MG tablet TAKE 1 TABLET BY MOUTH ONCE EVERY EVENING   Cranberry-Vitamin C-Probiotic (AZO CRANBERRY PO) Take 2 tablets by mouth daily.   omeprazole  (PRILOSEC) 20 MG capsule Take 1 capsule (20 mg total) by mouth daily.   Probiotic Product (PROBIOTIC DAILY PO) Take 1 tablet by mouth daily.   [DISCONTINUED] phenazopyridine (PYRIDIUM) 95 MG tablet Take 2 tablets by mouth daily in the afternoon.   No facility-administered encounter medications on file as of 04/24/2024.    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 Hulen   Family History  Problem Relation Age of Onset   Breast cancer Mother 20   Hyperlipidemia Mother    Osteoarthritis Mother  Rheum arthritis Mother    Lung cancer Father 31   Breast cancer Maternal Aunt        mat great aunt   Social History   Socioeconomic History   Marital status: Widowed    Spouse name: Not on file   Number of children: 1   Years of education: Not on file   Highest education level: Some college, no degree  Occupational History   Occupation: retired  Tobacco Use   Smoking status: Former    Current packs/day: 0.00    Average packs/day: 0.8 packs/day for 33.9 years  (25.4 ttl pk-yrs)    Types: Cigarettes    Start date: 19    Quit date: 06/26/2009    Years since quitting: 14.8   Smokeless tobacco: Never  Vaping Use   Vaping status: Never Used  Substance and Sexual Activity   Alcohol use: Yes    Alcohol/week: 9.0 standard drinks of alcohol    Types: 9 Cans of beer per week    Comment: 2-4 beers three evenings per week   Drug use: Yes    Types: Marijuana    Comment: a couple of times per month   Sexual activity: Not Currently  Other Topics Concern   Not on file  Social History Narrative   Not on file   Social Drivers of Health   Financial Resource Strain: Low Risk  (04/24/2024)   Overall Financial Resource Strain (CARDIA)    Difficulty of Paying Living Expenses: Not hard at all  Food Insecurity: No Food Insecurity (04/24/2024)   Hunger Vital Sign    Worried About Running Out of Food in the Last Year: Never true    Ran Out of Food in the Last Year: Never true  Transportation Needs: No Transportation Needs (04/24/2024)   PRAPARE - Administrator, Civil Service (Medical): No    Lack of Transportation (Non-Medical): No  Physical Activity: Sufficiently Active (04/24/2024)   Exercise Vital Sign    Days of Exercise per Week: 7 days    Minutes of Exercise per Session: 30 min  Recent Concern: Physical Activity - Insufficiently Active (03/17/2024)   Exercise Vital Sign    Days of Exercise per Week: 3 days    Minutes of Exercise per Session: 30 min  Stress: No Stress Concern Present (04/24/2024)   [employer] of Occupational Health - Occupational Stress Questionnaire    Feeling of Stress: Not at all  Recent Concern: Stress - Stress Concern Present (03/17/2024)   [employer] of Occupational Health - Occupational Stress Questionnaire    Feeling of Stress: To some extent  Social Connections: Moderately Isolated (04/24/2024)   Social Connection and Isolation Panel    Frequency of Communication with Friends and Family: More  than three times a week    Frequency of Social Gatherings with Friends and Family: More than three times a week    Attends Religious Services: More than 4 times per year    Active Member of [employer] or Organizations: No    Attends Banker Meetings: Never    Marital Status: Widowed    Tobacco Counseling Counseling given: Not Answered    Clinical Intake:  Pre-visit preparation completed: Yes  Pain : No/denies pain     BMI - recorded: 28.39 Nutritional Status: BMI 25 -29 Overweight Nutritional Risks: None Diabetes: No  Lab Results  Component Value Date   HGBA1C 5.9 (H) 04/24/2023   HGBA1C 5.8 (H) 04/19/2022   HGBA1C 5.7 (H)  04/15/2021     How often do you need to have someone help you when you read instructions, pamphlets, or other written materials from your doctor or pharmacy?: 1 - Never  Interpreter Needed?: No  Information entered by :: Vina Ned, CMA   Activities of Daily Living     04/24/2024    9:25 AM  In your present state of health, do you have any difficulty performing the following activities:  Hearing? 0  Vision? 0  Difficulty concentrating or making decisions? 0  Walking or climbing stairs? 0  Dressing or bathing? 0  Doing errands, shopping? 0  Preparing Food and eating ? N  Using the Toilet? N  In the past six months, have you accidently leaked urine? N  Do you have problems with loss of bowel control? N  Managing your Medications? N  Managing your Finances? N  Housekeeping or managing your Housekeeping? N    Patient Care Team: Justus Leita DEL, MD as PCP - General (Internal Medicine) Sharlot Agent, OD (Optometry)  I have updated your Care Teams any recent Medical Services you may have received from other providers in the past year.     Assessment:   This is a routine wellness examination for [employer].  Hearing/Vision screen Hearing Screening - Comments:: Denies hearing loss  Vision Screening - Comments:: Gets routine eye  exams, Dr. Agent Sharlot @ [facility] [place] [facility]   Goals Addressed               This Visit's Progress     Increase physical activity (pt-stated)         Depression Screen     04/24/2024    9:35 AM 03/18/2024    1:25 PM 12/21/2023   10:24 AM 11/08/2023    2:01 PM 09/18/2023    1:40 PM 07/28/2023    1:42 PM 04/24/2023    8:01 AM  PHQ 2/9 Scores  PHQ - 2 Score 0 0 0 0 0 0 0  PHQ- 9 Score 0 0 0 0 0 0 0    Fall Risk     04/24/2024    9:38 AM 03/18/2024    1:25 PM 12/21/2023   10:24 AM 11/08/2023    2:02 PM 09/18/2023    1:40 PM  Fall Risk   Falls in the past year? 0 0 0 0 0  Number falls in past yr: 0 0 0 0 0  Injury with Fall? 0 0 0 0 0  Risk for fall due to : No Fall Risks No Fall Risks No Fall Risks No Fall Risks No Fall Risks  Follow up Falls evaluation completed Falls evaluation completed Falls evaluation completed Falls evaluation completed Falls evaluation completed    MEDICARE RISK AT HOME:  Medicare Risk at Home Any stairs in or around the home?: Yes (3 steps) If so, are there any without handrails?: Yes Home free of loose throw rugs in walkways, pet beds, electrical cords, etc?: Yes Adequate lighting in your home to reduce risk of falls?: Yes Life alert?: No Use of a cane, walker or w/c?: No Grab bars in the bathroom?: Yes Shower chair or bench in shower?: No Elevated toilet seat or a handicapped toilet?: No  TIMED UP AND GO:  Was the test performed?  No  Cognitive Function: 6CIT completed        04/24/2024    9:39 AM  6CIT Screen  What Year? 0 points  What month? 0 points  What time? 0 points  Count back from 20 0 points  Months in reverse 0 points  Repeat phrase 0 points  Total Score 0 points    Immunizations Immunization History  Administered Date(s) Administered   Fluad Trivalent(High Dose 65+) 04/24/2023   Influenza, Quadrivalent, Recombinant, Inj, Pf 05/15/2018   Influenza,inj,Quad PF,6+ Mos 04/08/2019, 04/08/2020, 04/15/2021, 04/19/2022    Influenza-Unspecified 04/21/2016, 05/22/2017   Moderna Sars-Covid-2 Vaccination 10/10/2019, 11/13/2019   PNEUMOCOCCAL CONJUGATE-20 04/24/2023   Zoster, Live 07/04/2012    Screening Tests Health Maintenance  Topic Date Due   DTaP/Tdap/Td (1 - Tdap) Never done   Zoster Vaccines- Shingrix (1 of 2) 03/25/1977   Lung Cancer Screening  Never done   COVID-19 Vaccine (3 - Moderna risk series) 12/11/2019   DEXA SCAN  Never done   Colonoscopy  10/16/2023   Influenza Vaccine  03/08/2024   Mammogram  07/12/2024   Medicare Annual Wellness (AWV)  04/24/2025   Pneumococcal Vaccine: 50+ Years  Completed   Hepatitis C Screening  Addressed   HPV VACCINES  Aged Out   Meningococcal B Vaccine  Aged Out    Health Maintenance Items Addressed: Mammogram ordered, See Nurse Notes at the end of this note  Additional Screening:  Vision Screening: Recommended annual ophthalmology exams for early detection of glaucoma and other disorders of the eye. Is the patient up to date with their annual eye exam?  Yes  Who is the provider or what is the name of the office in which the patient attends annual eye exams? Dr. Lynwood Hasting @ [facility] [place]  Dental Screening: Recommended annual dental exams for proper oral hygiene  Community Resource Referral / Chronic Care Management: CRR required this visit?  No   CCM required this visit?  No   Plan:    I have personally reviewed and noted the following in the patient's chart:   Medical and social history Use of alcohol, tobacco or illicit drugs  Current medications and supplements including opioid prescriptions. Patient is not currently taking opioid prescriptions. Functional ability and status Nutritional status Physical activity Advanced directives List of other physicians Hospitalizations, surgeries, and ER visits in previous 12 months Vitals Screenings to include cognitive, depression, and falls Referrals and appointments  In addition, I have  reviewed and discussed with patient certain preventive protocols, quality metrics, and best practice recommendations. A written personalized care plan for preventive services as well as general preventive health recommendations were provided to patient.   Vina Ned, CMA   04/24/2024   After Visit Summary: (MyChart) Due to this being a telephonic visit, the after visit summary with patients personalized plan was offered to patient via MyChart   Notes:  Plans to get flu vaccine at next [facility] (04/25/24) Placed order for [facility] (due ~ 07/13/24) Patient wants to discuss with PCP a colonoscopy referral at OV on 04/25/24 Declined shingles, covid and Tdap vaccines Declined DEXA scan Declined lung cancer screening

## 2024-04-25 ENCOUNTER — Encounter: Payer: Self-pay | Admitting: Internal Medicine

## 2024-04-25 ENCOUNTER — Ambulatory Visit (INDEPENDENT_AMBULATORY_CARE_PROVIDER_SITE_OTHER): Payer: Self-pay | Admitting: Internal Medicine

## 2024-04-25 ENCOUNTER — Ambulatory Visit

## 2024-04-25 VITALS — BP 132/82 | HR 102 | Temp 98.4°F | Resp 18 | Ht 67.5 in | Wt 183.0 lb

## 2024-04-25 DIAGNOSIS — E785 Hyperlipidemia, unspecified: Secondary | ICD-10-CM | POA: Diagnosis not present

## 2024-04-25 DIAGNOSIS — Z23 Encounter for immunization: Secondary | ICD-10-CM | POA: Diagnosis not present

## 2024-04-25 DIAGNOSIS — Z1211 Encounter for screening for malignant neoplasm of colon: Secondary | ICD-10-CM

## 2024-04-25 DIAGNOSIS — Z1231 Encounter for screening mammogram for malignant neoplasm of breast: Secondary | ICD-10-CM

## 2024-04-25 DIAGNOSIS — K219 Gastro-esophageal reflux disease without esophagitis: Secondary | ICD-10-CM | POA: Diagnosis not present

## 2024-04-25 DIAGNOSIS — R7303 Prediabetes: Secondary | ICD-10-CM

## 2024-04-25 DIAGNOSIS — I10 Essential (primary) hypertension: Secondary | ICD-10-CM

## 2024-04-25 MED ORDER — ATORVASTATIN CALCIUM 40 MG PO TABS: 40.0000 mg | ORAL_TABLET | Freq: Every day | ORAL | 1 refills | Status: AC

## 2024-04-25 MED ORDER — OMEPRAZOLE 20 MG PO CPDR: 20.0000 mg | DELAYED_RELEASE_CAPSULE | Freq: Every day | ORAL | 1 refills | Status: DC

## 2024-04-25 MED ORDER — AMLODIPINE BESYLATE 10 MG PO TABS: 10.0000 mg | ORAL_TABLET | Freq: Every day | ORAL | 1 refills | Status: DC

## 2024-04-25 NOTE — Assessment & Plan Note (Signed)
 Managed with diet only. Lab Results  Component Value Date   HGBA1C 5.9 (H) 04/24/2023

## 2024-04-25 NOTE — Progress Notes (Signed)
 Date:  04/25/2024   Name:  Lisa Haas   DOB:  [birth year]   MRN:  [id number]   Chief Complaint: Annual Exam Lisa Haas is a 68 y.o. female who presents today for her Complete Annual Exam. She feels well. She reports exercising regularly. She reports she is sleeping well. Breast complaints none.  Health Maintenance  Topic Date Due   DTaP/Tdap/Td vaccine (1 - Tdap) Never done   Zoster (Shingles) Vaccine (1 of 2) 03/25/1977   Screening for Lung Cancer  Never done   COVID-19 Vaccine (3 - Moderna risk series) 12/11/2019   DEXA scan (bone density measurement)  Never done   Colon Cancer Screening  10/16/2023   Breast Cancer Screening  07/12/2024   Medicare Annual Wellness Visit  04/24/2025   Pneumococcal Vaccine for age over 43  Completed   Flu Shot  Completed   Hepatitis C Screening  Addressed   HPV Vaccine  Aged Out   Meningitis B Vaccine  Aged Out    Hypertension This is a chronic problem. The problem is controlled. Pertinent negatives include no chest pain, headaches, palpitations or shortness of breath. Past treatments include calcium  channel blockers. There is no history of kidney disease, CAD/MI or CVA.  Hyperlipidemia This is a chronic problem. The problem is controlled. Pertinent negatives include no chest pain, myalgias or shortness of breath. Current antihyperlipidemic treatment includes statins. The current treatment provides moderate improvement of lipids.  Gastroesophageal Reflux She complains of heartburn. She reports no abdominal pain, no chest pain, no coughing or no wheezing. This is a recurrent problem. The problem occurs occasionally. Pertinent negatives include no fatigue. She has tried a PPI for the symptoms.    Review of Systems  Constitutional:  Negative for fatigue and unexpected weight change.  HENT:  Negative for trouble swallowing.   Eyes:  Negative for visual disturbance.  Respiratory:  Negative for cough, chest tightness, shortness of breath and  wheezing.   Cardiovascular:  Negative for chest pain, palpitations and leg swelling.  Gastrointestinal:  Positive for heartburn. Negative for abdominal pain, constipation and diarrhea.  Musculoskeletal:  Negative for arthralgias and myalgias.  Neurological:  Negative for dizziness, weakness, light-headedness and headaches.     Lab Results  Component Value Date   NA 140 10/26/2023   K 4.4 10/26/2023   CO2 23 10/26/2023   GLUCOSE 133 (H) 10/26/2023   BUN 18 10/26/2023   CREATININE 0.95 10/26/2023   CALCIUM  9.2 10/26/2023   EGFR 88 04/24/2023   GFRNONAA >60 10/26/2023   Lab Results  Component Value Date   CHOL 233 (H) 04/24/2023   HDL 60 04/24/2023   LDLCALC 123 (H) 04/24/2023   TRIG 287 (H) 04/24/2023   CHOLHDL 3.9 04/24/2023   Lab Results  Component Value Date   TSH 3.000 04/24/2023   Lab Results  Component Value Date   HGBA1C 5.9 (H) 04/24/2023   Lab Results  Component Value Date   WBC 9.7 12/21/2023   HGB 13.6 12/21/2023   HCT 42.0 12/21/2023   MCV 89 12/21/2023   PLT 252 12/21/2023   Lab Results  Component Value Date   ALT 22 10/26/2023   AST 21 10/26/2023   ALKPHOS 68 10/26/2023   BILITOT 0.6 10/26/2023   Lab Results  Component Value Date   VD25OH 32.9 04/24/2023     Patient Active Problem List   Diagnosis Date Noted   Diverticulitis large intestine w/o perforation or abscess w/o bleeding 11/08/2023  Diverticulosis of sigmoid colon 11/08/2023   Prediabetes 04/24/2023   Primary osteoarthritis of both feet 11/17/2022   GERD without esophagitis 04/08/2019   Grade II hemorrhoids 10/03/2018   Bloodgood disease 06/22/2015   Dyslipidemia 06/22/2015   Essential (primary) hypertension 06/22/2015   History of adenomatous polyp of colon 06/22/2015   Hot flash, menopausal 06/22/2015   Heart palpitations 06/22/2015   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 Hulen    Social History   Tobacco Use   Smoking status: Former    Current packs/day: 0.00    Average packs/day: 0.8 packs/day for 33.9 years (25.4 ttl pk-yrs)    Types: Cigarettes    Start date: 49    Quit date: 06/26/2009    Years since quitting: 14.8   Smokeless tobacco: Never  Vaping Use   Vaping status: Never Used  Substance Use Topics   Alcohol use: Yes    Alcohol/week: 9.0 standard drinks of alcohol    Types: 9 Cans of beer per week    Comment: 2-4 beers three evenings per week   Drug use: Yes    Types: Marijuana    Comment: a couple of times per month     Medication list has been reviewed and updated.  Current Meds  Medication Sig   Aspirin-Acetaminophen-Caffeine (GOODYS EXTRA STRENGTH PO) Take 1 tablet by mouth daily.   Cranberry-Vitamin C-Probiotic (AZO CRANBERRY PO) Take 2 tablets by mouth daily.   Probiotic Product (PROBIOTIC DAILY PO) Take 1 tablet by mouth daily.   [DISCONTINUED] amLODipine  (NORVASC ) 10 MG tablet Take 1 tablet (10 mg total) by mouth daily.   [DISCONTINUED] atorvastatin  (LIPITOR) 40 MG tablet TAKE 1 TABLET BY MOUTH ONCE EVERY EVENING   [DISCONTINUED] omeprazole  (PRILOSEC) 20 MG capsule Take 1 capsule (20 mg total) by mouth daily.       03/18/2024    1:25 PM 12/21/2023   10:24 AM 11/08/2023    2:01 PM 09/18/2023    1:41 PM  GAD 7 : Generalized Anxiety Score  Nervous, Anxious, on Edge 0 0 0 0  Control/stop worrying 0 0 0 0  Worry too much - different things 0 0 0 0  Trouble relaxing 0 0 0 0  Restless 0 0 0 0  Easily annoyed or irritable 0 0 0 0  Afraid - awful might happen 0 0 0 0  Total GAD 7 Score 0 0 0 0  Anxiety Difficulty Not difficult at all Not difficult at all Not difficult at  all Not difficult at all       04/24/2024    9:35 AM 03/18/2024    1:25 PM 12/21/2023   10:24 AM  Depression screen PHQ 2/9  Decreased Interest 0 0 0  Down, Depressed, Hopeless 0 0 0  PHQ - 2 Score 0 0 0  Altered sleeping 0 0 0  Tired, decreased energy 0 0 0  Change in appetite 0 0 0  Feeling bad or failure about yourself  0 0 0  Trouble concentrating 0 0 0  Moving slowly or fidgety/restless 0  0 0  Suicidal thoughts 0 0 0  PHQ-9 Score 0 0 0  Difficult doing work/chores Not difficult at all Not difficult at all Not difficult at all    BP Readings from Last 3 Encounters:  04/25/24 132/82  03/18/24 128/70  12/21/23 134/74    Physical Exam Vitals and nursing note reviewed.  Constitutional:      General: She is not in acute distress.    Appearance: She is well-developed.  HENT:     Head: Normocephalic and atraumatic.     Right Ear: Tympanic membrane and ear canal normal.     Left Ear: Tympanic membrane and ear canal normal.     Nose:     Right Sinus: No maxillary sinus tenderness.     Left Sinus: No maxillary sinus tenderness.  Eyes:     General: No scleral icterus.       Right eye: No discharge.        Left eye: No discharge.     Conjunctiva/sclera: Conjunctivae normal.  Neck:     Thyroid: No thyromegaly.     Vascular: No carotid bruit.  Cardiovascular:     Rate and Rhythm: Normal rate and regular rhythm.     Pulses: Normal pulses.     Heart sounds: Normal heart sounds.  Pulmonary:     Effort: Pulmonary effort is normal. No respiratory distress.     Breath sounds: No wheezing.  Abdominal:     General: Bowel sounds are normal.     Palpations: Abdomen is soft.     Tenderness: There is no abdominal tenderness.  Musculoskeletal:     Cervical back: Normal range of motion. No erythema.     Right lower leg: No edema.     Left lower leg: No edema.  Lymphadenopathy:     Cervical: No cervical adenopathy.  Skin:    General: Skin is warm and dry.     Findings: No  rash.  Neurological:     Mental Status: She is alert and oriented to person, place, and time.     Cranial Nerves: No cranial nerve deficit.     Sensory: No sensory deficit.     Deep Tendon Reflexes: Reflexes are normal and symmetric.  Psychiatric:        Attention and Perception: Attention normal.        Mood and Affect: Mood normal.     Wt Readings from Last 3 Encounters:  04/25/24 183 lb (83 kg)  04/24/24 184 lb (83.5 kg)  03/18/24 183 lb (83 kg)    BP 132/82   Pulse (!) 102   Temp 98.4 F (36.9 C) (Oral)   Resp 18   Ht 5' 7.5 (1.715 m)   Wt 183 lb (83 kg)   SpO2 99%   BMI 28.24 kg/m   Assessment and Plan:  Problem List Items Addressed This Visit       Unprioritized   Dyslipidemia (Chronic)   LDL is  Lab Results  Component Value Date   LDLCALC 123 (H) 04/24/2023   Currently taking atorvastatin .  No medication side effects or other concerns. Recommended LDL goal is < 100.       Relevant Medications   atorvastatin  (LIPITOR) 40 MG tablet   Other Relevant Orders   Lipid panel   Essential (primary) hypertension - Primary (Chronic)   Blood pressure is well controlled on amlodipine . No medication side effects noted. Plan to continue current medications.       Relevant Medications  atorvastatin  (LIPITOR) 40 MG tablet   amLODipine  (NORVASC ) 10 MG tablet   Other Relevant Orders   CBC with Differential/Platelet   Comprehensive metabolic panel with GFR   TSH   Urinalysis, Routine w reflex microscopic   GERD without esophagitis (Chronic)   Reflux symptoms are controlled on omeprazole  daily. Patient denies red flag symptoms - no melena, weight loss, dysphagia.       Relevant Medications   omeprazole  (PRILOSEC) 20 MG capsule   Other Relevant Orders   CBC with Differential/Platelet   Prediabetes   Managed with diet only. Lab Results  Component Value Date   HGBA1C 5.9 (H) 04/24/2023         Relevant Orders   Hemoglobin A1c   Other Visit  Diagnoses       Encounter for screening mammogram for breast cancer       ordered - pt to schedule at [facility]     Colon cancer screening       Relevant Orders   Ambulatory referral to Gastroenterology     Immunization due       Relevant Orders   Flu vaccine trivalent PF, 6mos and older(Flulaval,Afluria,Fluarix,Fluzone) (Completed)       Return in about 6 months (around 10/23/2024) for [facility] HTN   Dr. Lemon.    Leita HILARIO Adie, MD [facility] [place]

## 2024-04-25 NOTE — Assessment & Plan Note (Signed)
 LDL is  Lab Results  Component Value Date   LDLCALC 123 (H) 04/24/2023   Currently taking atorvastatin .  No medication side effects or other concerns. Recommended LDL goal is < 100.

## 2024-04-25 NOTE — Assessment & Plan Note (Signed)
 Blood pressure is well controlled on amlodipine . No medication side effects noted. Plan to continue current medications.

## 2024-04-25 NOTE — Patient Instructions (Addendum)
 Call [facility] to schedule your mammogram and Bone Density at [phone].

## 2024-04-25 NOTE — Assessment & Plan Note (Signed)
 Reflux symptoms are controlled on omeprazole daily. Patient denies red flag symptoms - no melena, weight loss, dysphagia.

## 2024-04-26 ENCOUNTER — Ambulatory Visit: Payer: Self-pay | Admitting: Internal Medicine

## 2024-04-26 LAB — CBC WITH DIFFERENTIAL/PLATELET
Basophils Absolute: 0.1 x10E3/uL (ref 0.0–0.2)
Basos: 1 %
EOS (ABSOLUTE): 0.2 x10E3/uL (ref 0.0–0.4)
Eos: 2 %
Hematocrit: 42.9 % (ref 34.0–46.6)
Hemoglobin: 13.9 g/dL (ref 11.1–15.9)
Immature Grans (Abs): 0 x10E3/uL (ref 0.0–0.1)
Immature Granulocytes: 0 %
Lymphocytes Absolute: 1.8 x10E3/uL (ref 0.7–3.1)
Lymphs: 24 %
MCH: 30.2 pg (ref 26.6–33.0)
MCHC: 32.4 g/dL (ref 31.5–35.7)
MCV: 93 fL (ref 79–97)
Monocytes Absolute: 0.6 x10E3/uL (ref 0.1–0.9)
Monocytes: 8 %
Neutrophils Absolute: 4.8 x10E3/uL (ref 1.4–7.0)
Neutrophils: 65 %
Platelets: 279 x10E3/uL (ref 150–450)
RBC: 4.6 x10E6/uL (ref 3.77–5.28)
RDW: 12.2 % (ref 11.7–15.4)
WBC: 7.4 x10E3/uL (ref 3.4–10.8)

## 2024-04-26 LAB — URINALYSIS, ROUTINE W REFLEX MICROSCOPIC
Glucose, UA: NEGATIVE
Nitrite, UA: NEGATIVE
RBC, UA: NEGATIVE
Specific Gravity, UA: 1.027 (ref 1.005–1.030)
Urobilinogen, Ur: 1 mg/dL (ref 0.2–1.0)
pH, UA: 5.5 (ref 5.0–7.5)

## 2024-04-26 LAB — MICROSCOPIC EXAMINATION
Epithelial Cells (non renal): >10 /HPF — AB (ref 0–10)
WBC, UA: >30 /HPF — AB (ref 0–5)

## 2024-04-26 LAB — COMPREHENSIVE METABOLIC PANEL WITH GFR
ALT: 22 IU/L (ref 0–32)
AST: 20 IU/L (ref 0–40)
Albumin: 4.6 g/dL (ref 3.9–4.9)
Alkaline Phosphatase: 96 IU/L (ref 49–135)
BUN/Creatinine Ratio: 16 (ref 12–28)
BUN: 13 mg/dL (ref 8–27)
Bilirubin Total: 0.4 mg/dL (ref 0.0–1.2)
CO2: 21 mmol/L (ref 20–29)
Calcium: 9.6 mg/dL (ref 8.7–10.3)
Chloride: 103 mmol/L (ref 96–106)
Creatinine, Ser: 0.83 mg/dL (ref 0.57–1.00)
Globulin, Total: 2.6 g/dL (ref 1.5–4.5)
Glucose: 103 mg/dL — ABNORMAL HIGH (ref 70–99)
Potassium: 4.9 mmol/L (ref 3.5–5.2)
Sodium: 141 mmol/L (ref 134–144)
Total Protein: 7.2 g/dL (ref 6.0–8.5)
eGFR: 78 mL/min/1.73 (ref >59)

## 2024-04-26 LAB — TSH: TSH: 2.09 u[IU]/mL (ref 0.450–4.500)

## 2024-04-26 LAB — HEMOGLOBIN A1C
Est. average glucose Bld gHb Est-mCnc: 117 mg/dL
Hgb A1c MFr Bld: 5.7 % — ABNORMAL HIGH (ref 4.8–5.6)

## 2024-04-26 LAB — LIPID PANEL
Chol/HDL Ratio: 3.4 ratio (ref 0.0–4.4)
Cholesterol, Total: 232 mg/dL — ABNORMAL HIGH (ref 100–199)
HDL: 68 mg/dL (ref >39)
LDL Chol Calc (NIH): 144 mg/dL — ABNORMAL HIGH (ref 0–99)
Triglycerides: 113 mg/dL (ref 0–149)
VLDL Cholesterol Cal: 20 mg/dL (ref 5–40)

## 2024-05-15 ENCOUNTER — Other Ambulatory Visit: Payer: Self-pay | Admitting: Internal Medicine

## 2024-05-15 DIAGNOSIS — Z780 Asymptomatic menopausal state: Secondary | ICD-10-CM

## 2024-05-15 DIAGNOSIS — Z13820 Encounter for screening for osteoporosis: Secondary | ICD-10-CM

## 2024-05-27 ENCOUNTER — Other Ambulatory Visit: Payer: Self-pay | Admitting: Internal Medicine

## 2024-05-27 ENCOUNTER — Telehealth: Payer: Self-pay | Admitting: Internal Medicine

## 2024-05-27 DIAGNOSIS — B952 Enterococcus as the cause of diseases classified elsewhere: Secondary | ICD-10-CM

## 2024-05-27 NOTE — Telephone Encounter (Signed)
 Copied from CRM [id number]. Topic: Referral - Request for Referral >> May 27, 2024  1:14 PM Shanda MATSU wrote: Did the patient discuss referral with their provider in the last year? Yes (If No - schedule appointment) (If Yes - send message)  Appointment offered? No  Type of order/referral and detailed reason for visit: Urologist for UTI that continually returns  Preference of office, provider, location: No preference, prefers a location in [place]  If referral order, have you been seen by this specialty before? No (If Yes, this issue or another issue? When? Where?  Can we respond through MyChart? Yes >> May 27, 2024  1:18 PM Shanda MATSU wrote: Patient prefers to be referred to provider within the [facility], preferably in [place] if avail.

## 2024-05-27 NOTE — Telephone Encounter (Signed)
 Please review. Pt wants referral.  KP

## 2024-05-27 NOTE — Progress Notes (Unsigned)
 Date:  05/27/2024   Name:  Lisa Haas   DOB:  May 05, 1956   MRN:  [id number]   Chief Complaint: No chief complaint on file.  HPI  Review of Systems   Lab Results  Component Value Date   NA 141 04/25/2024   K 4.9 04/25/2024   CO2 21 04/25/2024   GLUCOSE 103 (H) 04/25/2024   BUN 13 04/25/2024   CREATININE 0.83 04/25/2024   CALCIUM  9.6 04/25/2024   EGFR 78 04/25/2024   GFRNONAA >60 10/26/2023   Lab Results  Component Value Date   CHOL 232 (H) 04/25/2024   HDL 68 04/25/2024   LDLCALC 144 (H) 04/25/2024   TRIG 113 04/25/2024   CHOLHDL 3.4 04/25/2024   Lab Results  Component Value Date   TSH 2.090 04/25/2024   Lab Results  Component Value Date   HGBA1C 5.7 (H) 04/25/2024   Lab Results  Component Value Date   WBC 7.4 04/25/2024   HGB 13.9 04/25/2024   HCT 42.9 04/25/2024   MCV 93 04/25/2024   PLT 279 04/25/2024   Lab Results  Component Value Date   ALT 22 04/25/2024   AST 20 04/25/2024   ALKPHOS 96 04/25/2024   BILITOT 0.4 04/25/2024   Lab Results  Component Value Date   VD25OH 32.9 04/24/2023     Patient Active Problem List   Diagnosis Date Noted   Diverticulitis large intestine w/o perforation or abscess w/o bleeding 11/08/2023   Diverticulosis of sigmoid colon 11/08/2023   Prediabetes 04/24/2023   Primary osteoarthritis of both feet 11/17/2022   GERD without esophagitis 04/08/2019   Grade II hemorrhoids 10/03/2018   Bloodgood disease 06/22/2015   Dyslipidemia 06/22/2015   Essential (primary) hypertension 06/22/2015   History of adenomatous polyp of colon 06/22/2015   Hot flash, menopausal 06/22/2015   Heart palpitations 06/22/2015   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 Hulen    Social History   Tobacco Use   Smoking status: Former    Current packs/day: 0.00    Average packs/day: 0.8 packs/day for 33.9 years (25.4 ttl pk-yrs)    Types: Cigarettes    Start date: 31    Quit date: 06/26/2009    Years since quitting: 14.9   Smokeless tobacco: Never  Vaping Use   Vaping status: Never Used  Substance Use Topics   Alcohol use: Yes    Alcohol/week: 9.0 standard drinks of alcohol    Types: 9 Cans of beer per week    Comment: 2-4 beers three evenings per week   Drug use: Yes    Types: Marijuana    Comment: a couple of times per month     Medication list has been reviewed and updated.  No outpatient medications have been marked as taking for the 05/27/24 encounter (Orders Only) with Justus Leita DEL, MD.       03/18/2024    1:25 PM 12/21/2023   10:24 AM 11/08/2023    2:01 PM 09/18/2023    1:41 PM  GAD 7 : Generalized Anxiety Score  Nervous, Anxious, on Edge 0 0 0 0  Control/stop worrying 0 0 0 0  Worry too much - different things 0 0 0 0  Trouble relaxing 0 0 0 0  Restless 0 0 0 0  Easily annoyed or irritable 0 0 0 0  Afraid - awful might happen 0 0 0 0  Total GAD 7 Score 0 0 0 0  Anxiety Difficulty Not difficult at all Not difficult at all Not difficult at all Not difficult at all       04/24/2024    9:35 AM 03/18/2024    1:25 PM 12/21/2023   10:24 AM  Depression screen PHQ 2/9  Decreased Interest 0 0 0  Down, Depressed, Hopeless 0 0 0  PHQ - 2 Score 0 0 0  Altered sleeping 0 0 0  Tired, decreased energy 0 0 0  Change in appetite 0 0 0  Feeling bad or failure about yourself  0 0 0  Trouble concentrating 0 0 0  Moving slowly or fidgety/restless 0 0 0  Suicidal thoughts 0 0 0  PHQ-9 Score 0 0 0  Difficult doing work/chores Not difficult at all Not difficult at all Not difficult at all    BP Readings from Last 3 Encounters:  04/25/24 132/82   03/18/24 128/70  12/21/23 134/74    Physical Exam  Wt Readings from Last 3 Encounters:  04/25/24 183 lb (83 kg)  04/24/24 184 lb (83.5 kg)  03/18/24 183 lb (83 kg)    There were no vitals taken for this visit.  Assessment and Plan:  Problem List Items Addressed This Visit   None   No follow-ups on file.    Leita HILARIO Adie, MD [facility] [place]

## 2024-06-04 ENCOUNTER — Encounter: Payer: Self-pay | Admitting: Urology

## 2024-06-04 ENCOUNTER — Ambulatory Visit (INDEPENDENT_AMBULATORY_CARE_PROVIDER_SITE_OTHER): Admitting: Urology

## 2024-06-04 VITALS — BP 124/75 | HR 90 | Ht 67.5 in | Wt 185.8 lb

## 2024-06-04 DIAGNOSIS — N390 Urinary tract infection, site not specified: Secondary | ICD-10-CM | POA: Diagnosis not present

## 2024-06-04 DIAGNOSIS — R935 Abnormal findings on diagnostic imaging of other abdominal regions, including retroperitoneum: Secondary | ICD-10-CM | POA: Diagnosis not present

## 2024-06-04 DIAGNOSIS — R1024 Suprapubic pain: Secondary | ICD-10-CM

## 2024-06-04 DIAGNOSIS — R8290 Unspecified abnormal findings in urine: Secondary | ICD-10-CM | POA: Diagnosis not present

## 2024-06-04 NOTE — Patient Instructions (Signed)
 Please call [phone] or [phone] to schedule your imaging prior to your appointment.

## 2024-06-04 NOTE — Progress Notes (Signed)
   06/04/24 12:52 PM   Lisa Haas 1955-08-09 [id number]  CC: Recurrent UTI, pelvic pressure, urinary symptoms  HPI: 68 year old female who presented with lower abdominal pain in March 2025 and diagnosed with severe sigmoid diverticulitis, she was treated with antibiotics.  No follow-up imaging was performed.  Kidneys and bladder were normal on that CT scan.  She had a culture documented UTI in May, unclear what symptoms she was having at that time, and a benign urinalysis that was contaminated with epithelial cells in September 2025.  She denies any specific urinary symptoms like dysuria or gross hematuria, but has some baseline urgency and frequency during the day.  She also has nocturia 2-4 times overnight.  She denies any fevers or chills.  Her primary complaint today is some suprapubic pressure and discomfort that has persisted since her diverticulitis in March 2025.   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 Hulen    Family History: Family History  Problem Relation Age of Onset   Breast cancer Mother 61   Hyperlipidemia Mother    Osteoarthritis Mother    Rheum arthritis Mother    Lung cancer Father 5   Breast cancer Maternal Aunt        mat great aunt    Social History:  reports that she quit smoking about 14 years ago. Her smoking use included cigarettes. She started smoking about 48 years ago. She has a 25.4 pack-year smoking history. She has never used smokeless tobacco. She reports current alcohol use of about 9.0 standard drinks of alcohol per week. She  reports current drug use. Drug: Marijuana.  Physical Exam: BP 124/75 (BP Location: Left Arm, Patient Position: Sitting, Cuff Size: Large)   Pulse 90   Ht 5' 7.5 (1.715 m)   Wt 185 lb 12.8 oz (84.3 kg)   SpO2 97%   BMI 28.67 kg/m    Constitutional:  Alert and oriented, No acute distress. Cardiovascular: No clubbing, cyanosis, or edema. Respiratory: Normal respiratory effort, no increased work of breathing. GI: Abdomen is soft, nontender, nondistended, no abdominal masses   Laboratory Data: Reviewed in epic, culture data not available  Pertinent Imaging: I have personally viewed and interpreted the CT abdomen and pelvis with contrast from March 2025 showing diverticulitis, normal bladder with no air, no hydronephrosis, no stones.  Assessment & Plan:   68 year old female with severe diverticulitis in March 2025 treated with antibiotics, persistent lower abdominal pain.  Recent urinalysis benign.  We discussed possible etiologies including persistent diverticulitis, abscess, colovesical fistula, recurrent UTI, overactive bladder.  I recommended starting with a repeat CT scan to evaluate for residual abscess or colovesical fistula and will call with those results.  If benign, will trial oxybutynin 10 mg XL daily.  Recommended continuing cranberry tablets daily for UTI prevention.  Call with CT results   Redell Burnet, MD 06/04/2024  [facility] [address], [address] [place], [place] [address] [phone]

## 2024-06-06 ENCOUNTER — Ambulatory Visit
Admission: RE | Admit: 2024-06-06 | Discharge: 2024-06-06 | Disposition: A | Discharge status: Home / Self Care | Source: Ambulatory Visit | Attending: Urology | Admitting: Urology

## 2024-06-06 DIAGNOSIS — R1024 Suprapubic pain: Secondary | ICD-10-CM | POA: Diagnosis present

## 2024-06-06 DIAGNOSIS — R935 Abnormal findings on diagnostic imaging of other abdominal regions, including retroperitoneum: Secondary | ICD-10-CM | POA: Insufficient documentation

## 2024-06-06 MED ORDER — IOHEXOL 9 MG/ML PO SOLN
500.0000 mL | ORAL | Status: AC
  Administered 2024-06-06 (×2): 500 mL via ORAL

## 2024-06-06 MED ORDER — IOHEXOL 300 MG/ML  SOLN
100.0000 mL | Freq: Once | INTRAMUSCULAR | Status: AC | PRN
  Administered 2024-06-06: 100 mL via INTRAVENOUS

## 2024-06-11 ENCOUNTER — Ambulatory Visit: Payer: Self-pay | Admitting: Urology

## 2024-06-11 DIAGNOSIS — B3731 Acute candidiasis of vulva and vagina: Secondary | ICD-10-CM

## 2024-06-11 DIAGNOSIS — K5792 Diverticulitis of intestine, part unspecified, without perforation or abscess without bleeding: Secondary | ICD-10-CM

## 2024-06-11 MED ORDER — METRONIDAZOLE 500 MG PO TABS: 500.0000 mg | ORAL_TABLET | Freq: Two times a day (BID) | ORAL | 0 refills | Status: DC

## 2024-06-11 MED ORDER — METRONIDAZOLE 500 MG PO TABS: 500.0000 mg | ORAL_TABLET | Freq: Three times a day (TID) | ORAL | 0 refills | Status: AC

## 2024-06-11 MED ORDER — FLUCONAZOLE 150 MG PO TABS: 150.0000 mg | ORAL_TABLET | Freq: Once | ORAL | 0 refills | Status: AC

## 2024-06-11 MED ORDER — CIPROFLOXACIN HCL 500 MG PO TABS: 500.0000 mg | ORAL_TABLET | Freq: Two times a day (BID) | ORAL | 0 refills | Status: AC

## 2024-06-11 NOTE — Addendum Note (Signed)
 Addended by: ELOUISE GABA C on: 06/11/2024 01:30 PM   Modules accepted: Orders

## 2024-06-11 NOTE — Telephone Encounter (Signed)
 RX sent in. Referral placed.

## 2024-06-18 ENCOUNTER — Telehealth: Payer: Self-pay

## 2024-06-18 NOTE — Telephone Encounter (Signed)
 Called pt informed her why she needs to be on 2 antibiotics for diverticulitis and what Diflucan is prescribed for. Pt voiced understanding.

## 2024-06-25 ENCOUNTER — Encounter: Payer: Self-pay | Admitting: Internal Medicine

## 2024-07-15 ENCOUNTER — Ambulatory Visit
Admission: RE | Admit: 2024-07-15 | Discharge: 2024-07-15 | Disposition: A | Source: Ambulatory Visit | Attending: Internal Medicine | Admitting: Internal Medicine

## 2024-07-15 DIAGNOSIS — Z1231 Encounter for screening mammogram for malignant neoplasm of breast: Secondary | ICD-10-CM

## 2024-07-15 DIAGNOSIS — Z780 Asymptomatic menopausal state: Secondary | ICD-10-CM

## 2024-07-15 DIAGNOSIS — Z13820 Encounter for screening for osteoporosis: Secondary | ICD-10-CM

## 2024-07-17 ENCOUNTER — Ambulatory Visit: Payer: Self-pay | Admitting: Internal Medicine

## 2024-07-17 ENCOUNTER — Encounter: Payer: Self-pay | Admitting: Internal Medicine

## 2024-07-17 NOTE — Telephone Encounter (Signed)
 FYI

## 2024-07-27 ENCOUNTER — Other Ambulatory Visit: Payer: Self-pay | Admitting: Internal Medicine

## 2024-07-27 DIAGNOSIS — K219 Gastro-esophageal reflux disease without esophagitis: Secondary | ICD-10-CM

## 2024-07-27 DIAGNOSIS — I10 Essential (primary) hypertension: Secondary | ICD-10-CM

## 2024-07-31 NOTE — Telephone Encounter (Signed)
 Requested by interface surescripts. Future visit 10/25/24.  Requested Prescriptions  Pending Prescriptions Disp Refills   omeprazole  (PRILOSEC) 20 MG capsule [Pharmacy Med Name: OMEPRAZOLE  DR 20 MG CAP] 90 capsule 0    Sig: TAKE 1 CAPSULE BY MOUTH ONCE DAILY     Gastroenterology: Proton Pump Inhibitors Passed - 07/31/2024  9:51 AM      Passed - Valid encounter within last 12 months    Recent Outpatient Visits           3 months ago Essential (primary) hypertension   [place] [facility] at [facility], Leita DEL, MD   4 months ago Acute non-recurrent maxillary sinusitis   [place] [facility] at [facility], Leita DEL, MD   7 months ago UTI symptoms   [facility] at [facility], Leita DEL, MD   8 months ago Diverticulitis large intestine w/o perforation or abscess w/o bleeding   [place] [facility] at [facility], Leita DEL, MD   10 months ago Change in bowel habits   [place] [facility] at [facility], Leita DEL, MD               amLODipine  (NORVASC ) 10 MG tablet [Pharmacy Med Name: AMLODIPINE  BESYLATE 10 MG TAB] 90 tablet 0    Sig: TAKE 1 TABLET BY MOUTH ONCE DAILY     Cardiovascular: Calcium  Channel Blockers 2 Passed - 07/31/2024  9:51 AM      Passed - Last BP in normal range    BP Readings from Last 1 Encounters:  06/04/24 124/75         Passed - Last Heart Rate in normal range    Pulse Readings from Last 1 Encounters:  06/04/24 90         Passed - Valid encounter within last 6 months    Recent Outpatient Visits           3 months ago Essential (primary) hypertension   [place] [facility] at [facility], Leita DEL, MD   4 months ago Acute non-recurrent maxillary sinusitis   [place] [facility] at [facility], Leita DEL, MD   7 months ago UTI symptoms   [facility] at [place] [place], Leita DEL, MD   8 months ago Diverticulitis large intestine w/o perforation or abscess w/o bleeding   [facility] at [facility], Leita DEL, MD   10 months ago Change in bowel habits   [facility] at [facility], Leita DEL, MD

## 2024-10-25 ENCOUNTER — Encounter: Admitting: Student

## 2025-04-28 ENCOUNTER — Encounter: Admitting: Student

## 2025-04-30 ENCOUNTER — Ambulatory Visit
